# Patient Record
Sex: Female | Born: 1950
Health system: Southern US, Community
[De-identification: ages and names within clinical notes are randomized; demographics above are authoritative.]

## PROBLEM LIST (undated history)

## (undated) DIAGNOSIS — R42 Dizziness and giddiness: Secondary | ICD-10-CM

## (undated) DIAGNOSIS — K219 Gastro-esophageal reflux disease without esophagitis: Secondary | ICD-10-CM

## (undated) DIAGNOSIS — S2220XA Unspecified fracture of sternum, initial encounter for closed fracture: Secondary | ICD-10-CM

## (undated) DIAGNOSIS — M5136 Other intervertebral disc degeneration, lumbar region: Secondary | ICD-10-CM

## (undated) DIAGNOSIS — M5030 Other cervical disc degeneration, unspecified cervical region: Secondary | ICD-10-CM

## (undated) DIAGNOSIS — Z1371 Encounter for nonprocreative screening for genetic disease carrier status: Secondary | ICD-10-CM

## (undated) DIAGNOSIS — J45909 Unspecified asthma, uncomplicated: Secondary | ICD-10-CM

## (undated) DIAGNOSIS — E7800 Pure hypercholesterolemia, unspecified: Secondary | ICD-10-CM

## (undated) DIAGNOSIS — G540 Brachial plexus disorders: Secondary | ICD-10-CM

## (undated) DIAGNOSIS — Z923 Personal history of irradiation: Secondary | ICD-10-CM

## (undated) DIAGNOSIS — G8929 Other chronic pain: Secondary | ICD-10-CM

## (undated) DIAGNOSIS — E785 Hyperlipidemia, unspecified: Secondary | ICD-10-CM

## (undated) DIAGNOSIS — J449 Chronic obstructive pulmonary disease, unspecified: Secondary | ICD-10-CM

## (undated) DIAGNOSIS — C50919 Malignant neoplasm of unspecified site of unspecified female breast: Secondary | ICD-10-CM

## (undated) DIAGNOSIS — I10 Essential (primary) hypertension: Secondary | ICD-10-CM

## (undated) DIAGNOSIS — I890 Lymphedema, not elsewhere classified: Secondary | ICD-10-CM

## (undated) DIAGNOSIS — F419 Anxiety disorder, unspecified: Secondary | ICD-10-CM

## (undated) DIAGNOSIS — K5790 Diverticulosis of intestine, part unspecified, without perforation or abscess without bleeding: Secondary | ICD-10-CM

## (undated) DIAGNOSIS — M51369 Other intervertebral disc degeneration, lumbar region without mention of lumbar back pain or lower extremity pain: Secondary | ICD-10-CM

## (undated) DIAGNOSIS — M542 Cervicalgia: Secondary | ICD-10-CM

## (undated) HISTORY — DX: Hyperlipidemia, unspecified: E78.5

## (undated) HISTORY — DX: Malignant neoplasm of unspecified site of unspecified female breast: C50.919

## (undated) HISTORY — DX: Encounter for nonprocreative screening for genetic disease carrier status: Z13.71

## (undated) HISTORY — PX: APPENDECTOMY: SHX54

## (undated) HISTORY — PX: OTHER SURGICAL HISTORY: SHX169

## (undated) HISTORY — PX: CERVICAL SPINE SURGERY: SHX589

---

## 1998-12-27 ENCOUNTER — Encounter: Payer: Self-pay | Admitting: Neurosurgery

## 1998-12-31 ENCOUNTER — Encounter: Payer: Self-pay | Admitting: Neurosurgery

## 1998-12-31 ENCOUNTER — Inpatient Hospital Stay (HOSPITAL_COMMUNITY): Admission: RE | Admit: 1998-12-31 | Discharge: 1999-01-02 | Payer: Self-pay | Admitting: Neurosurgery

## 1999-06-02 HISTORY — PX: LUMBAR DISC SURGERY: SHX700

## 2001-06-01 DIAGNOSIS — C50919 Malignant neoplasm of unspecified site of unspecified female breast: Secondary | ICD-10-CM

## 2001-06-01 HISTORY — DX: Malignant neoplasm of unspecified site of unspecified female breast: C50.919

## 2001-06-01 HISTORY — PX: BREAST LUMPECTOMY: SHX2

## 2001-11-09 ENCOUNTER — Ambulatory Visit: Admission: RE | Admit: 2001-11-09 | Discharge: 2002-02-07 | Payer: Self-pay | Admitting: Radiation Oncology

## 2001-11-09 ENCOUNTER — Encounter (HOSPITAL_COMMUNITY): Admission: RE | Admit: 2001-11-09 | Discharge: 2001-12-09 | Payer: Self-pay | Admitting: Oncology

## 2002-02-20 ENCOUNTER — Encounter: Admission: RE | Admit: 2002-02-20 | Discharge: 2002-02-20 | Payer: Self-pay | Admitting: Oncology

## 2002-05-16 ENCOUNTER — Encounter: Admission: RE | Admit: 2002-05-16 | Discharge: 2002-05-16 | Payer: Self-pay | Admitting: Oncology

## 2002-05-16 ENCOUNTER — Encounter (HOSPITAL_COMMUNITY): Admission: RE | Admit: 2002-05-16 | Discharge: 2002-06-15 | Payer: Self-pay | Admitting: Oncology

## 2002-11-10 ENCOUNTER — Encounter: Payer: Self-pay | Admitting: Family Medicine

## 2002-11-10 ENCOUNTER — Ambulatory Visit (HOSPITAL_COMMUNITY): Admission: RE | Admit: 2002-11-10 | Discharge: 2002-11-10 | Payer: Self-pay | Admitting: Family Medicine

## 2002-11-27 ENCOUNTER — Encounter (HOSPITAL_COMMUNITY): Admission: RE | Admit: 2002-11-27 | Discharge: 2002-12-27 | Payer: Self-pay | Admitting: Oncology

## 2002-11-27 ENCOUNTER — Encounter: Admission: RE | Admit: 2002-11-27 | Discharge: 2002-11-27 | Payer: Self-pay | Admitting: Oncology

## 2003-02-21 ENCOUNTER — Encounter: Admission: RE | Admit: 2003-02-21 | Discharge: 2003-02-21 | Payer: Self-pay | Admitting: General Surgery

## 2003-02-21 ENCOUNTER — Encounter (INDEPENDENT_AMBULATORY_CARE_PROVIDER_SITE_OTHER): Payer: Self-pay | Admitting: Specialist

## 2003-02-21 ENCOUNTER — Encounter: Payer: Self-pay | Admitting: General Surgery

## 2003-09-19 ENCOUNTER — Encounter (HOSPITAL_COMMUNITY): Admission: RE | Admit: 2003-09-19 | Discharge: 2003-10-19 | Payer: Self-pay | Admitting: Oncology

## 2003-09-19 ENCOUNTER — Encounter: Admission: RE | Admit: 2003-09-19 | Discharge: 2003-09-19 | Payer: Self-pay | Admitting: Oncology

## 2006-06-25 ENCOUNTER — Emergency Department (HOSPITAL_COMMUNITY): Admission: EM | Admit: 2006-06-25 | Discharge: 2006-06-26 | Payer: Self-pay | Admitting: Emergency Medicine

## 2006-07-07 ENCOUNTER — Encounter: Admission: RE | Admit: 2006-07-07 | Discharge: 2006-07-07 | Payer: Self-pay

## 2007-04-04 ENCOUNTER — Encounter: Admission: RE | Admit: 2007-04-04 | Discharge: 2007-04-04 | Payer: Self-pay | Admitting: Unknown Physician Specialty

## 2008-03-23 ENCOUNTER — Other Ambulatory Visit: Admission: RE | Admit: 2008-03-23 | Discharge: 2008-03-23 | Payer: Self-pay | Admitting: Obstetrics & Gynecology

## 2008-03-26 ENCOUNTER — Ambulatory Visit: Payer: Self-pay | Admitting: Oncology

## 2008-03-30 ENCOUNTER — Encounter: Admission: RE | Admit: 2008-03-30 | Discharge: 2008-03-30 | Payer: Self-pay | Admitting: Obstetrics & Gynecology

## 2008-04-11 ENCOUNTER — Encounter: Admission: RE | Admit: 2008-04-11 | Discharge: 2008-04-11 | Payer: Self-pay | Admitting: Obstetrics & Gynecology

## 2008-05-02 ENCOUNTER — Ambulatory Visit (HOSPITAL_COMMUNITY): Admission: RE | Admit: 2008-05-02 | Discharge: 2008-05-02 | Payer: Self-pay | Admitting: Oncology

## 2008-06-01 HISTORY — PX: OOPHORECTOMY: SHX86

## 2008-06-11 ENCOUNTER — Ambulatory Visit (HOSPITAL_COMMUNITY): Admission: RE | Admit: 2008-06-11 | Discharge: 2008-06-11 | Payer: Self-pay | Admitting: Obstetrics & Gynecology

## 2008-06-11 ENCOUNTER — Encounter: Payer: Self-pay | Admitting: Obstetrics & Gynecology

## 2008-07-13 ENCOUNTER — Emergency Department (HOSPITAL_COMMUNITY): Admission: EM | Admit: 2008-07-13 | Discharge: 2008-07-14 | Payer: Self-pay | Admitting: Emergency Medicine

## 2008-08-09 ENCOUNTER — Ambulatory Visit: Payer: Self-pay | Admitting: Oncology

## 2008-08-13 LAB — COMPREHENSIVE METABOLIC PANEL
BUN: 12 mg/dL (ref 6–23)
CO2: 29 mEq/L (ref 19–32)
Calcium: 9 mg/dL (ref 8.4–10.5)
Chloride: 106 mEq/L (ref 96–112)
Creatinine, Ser: 0.59 mg/dL (ref 0.40–1.20)

## 2008-08-13 LAB — CBC WITH DIFFERENTIAL/PLATELET
Basophils Absolute: 0 10*3/uL (ref 0.0–0.1)
EOS%: 1.7 % (ref 0.0–7.0)
HCT: 38.7 % (ref 34.8–46.6)
HGB: 13.2 g/dL (ref 11.6–15.9)
MCH: 31 pg (ref 25.1–34.0)
MONO#: 0.6 10*3/uL (ref 0.1–0.9)
NEUT%: 50.1 % (ref 38.4–76.8)
lymph#: 2.3 10*3/uL (ref 0.9–3.3)

## 2009-05-10 ENCOUNTER — Encounter: Admission: RE | Admit: 2009-05-10 | Discharge: 2009-05-10 | Payer: Self-pay | Admitting: Obstetrics & Gynecology

## 2009-08-09 ENCOUNTER — Ambulatory Visit: Payer: Self-pay | Admitting: Oncology

## 2009-08-13 ENCOUNTER — Ambulatory Visit (HOSPITAL_COMMUNITY): Admission: RE | Admit: 2009-08-13 | Discharge: 2009-08-13 | Payer: Self-pay | Admitting: Oncology

## 2009-08-13 LAB — COMPREHENSIVE METABOLIC PANEL
CO2: 31 mEq/L (ref 19–32)
Creatinine, Ser: 0.65 mg/dL (ref 0.40–1.20)
Glucose, Bld: 91 mg/dL (ref 70–99)
Total Bilirubin: 0.6 mg/dL (ref 0.3–1.2)

## 2009-08-13 LAB — CBC WITH DIFFERENTIAL/PLATELET
Eosinophils Absolute: 0.2 10*3/uL (ref 0.0–0.5)
HCT: 38.2 % (ref 34.8–46.6)
LYMPH%: 34.5 % (ref 14.0–49.7)
MCHC: 34.7 g/dL (ref 31.5–36.0)
MCV: 94.6 fL (ref 79.5–101.0)
MONO#: 0.5 10*3/uL (ref 0.1–0.9)
NEUT#: 4.1 10*3/uL (ref 1.5–6.5)
NEUT%: 55.5 % (ref 38.4–76.8)
Platelets: 190 10*3/uL (ref 145–400)
WBC: 7.4 10*3/uL (ref 3.9–10.3)

## 2009-08-22 ENCOUNTER — Ambulatory Visit (HOSPITAL_COMMUNITY): Admission: RE | Admit: 2009-08-22 | Discharge: 2009-08-22 | Payer: Self-pay | Admitting: Oncology

## 2010-02-17 ENCOUNTER — Emergency Department (HOSPITAL_COMMUNITY): Admission: EM | Admit: 2010-02-17 | Discharge: 2010-02-17 | Payer: Self-pay | Admitting: Emergency Medicine

## 2010-03-03 ENCOUNTER — Ambulatory Visit (HOSPITAL_COMMUNITY): Admission: RE | Admit: 2010-03-03 | Discharge: 2010-03-03 | Payer: Self-pay | Admitting: Internal Medicine

## 2010-06-23 ENCOUNTER — Encounter
Admission: RE | Admit: 2010-06-23 | Discharge: 2010-06-23 | Payer: Self-pay | Source: Home / Self Care | Attending: Neurosurgery | Admitting: Neurosurgery

## 2010-07-02 ENCOUNTER — Encounter: Payer: Self-pay | Admitting: Obstetrics & Gynecology

## 2010-07-02 ENCOUNTER — Encounter: Payer: Self-pay | Admitting: Neurosurgery

## 2010-07-07 ENCOUNTER — Other Ambulatory Visit: Payer: Self-pay | Admitting: Obstetrics & Gynecology

## 2010-07-07 DIAGNOSIS — Z1231 Encounter for screening mammogram for malignant neoplasm of breast: Secondary | ICD-10-CM

## 2010-07-14 ENCOUNTER — Ambulatory Visit
Admission: RE | Admit: 2010-07-14 | Discharge: 2010-07-14 | Disposition: A | Discharge status: Home / Self Care | Payer: BC Managed Care – PPO | Source: Ambulatory Visit | Attending: Obstetrics & Gynecology | Admitting: Obstetrics & Gynecology

## 2010-07-14 DIAGNOSIS — Z1231 Encounter for screening mammogram for malignant neoplasm of breast: Secondary | ICD-10-CM

## 2010-07-24 ENCOUNTER — Other Ambulatory Visit: Payer: Self-pay | Admitting: Obstetrics & Gynecology

## 2010-07-24 DIAGNOSIS — R928 Other abnormal and inconclusive findings on diagnostic imaging of breast: Secondary | ICD-10-CM

## 2010-07-29 ENCOUNTER — Other Ambulatory Visit: Payer: Self-pay

## 2010-08-05 ENCOUNTER — Ambulatory Visit
Admission: RE | Admit: 2010-08-05 | Discharge: 2010-08-05 | Disposition: A | Discharge status: Home / Self Care | Payer: BC Managed Care – PPO | Source: Ambulatory Visit | Attending: Obstetrics & Gynecology | Admitting: Obstetrics & Gynecology

## 2010-08-05 DIAGNOSIS — R928 Other abnormal and inconclusive findings on diagnostic imaging of breast: Secondary | ICD-10-CM

## 2010-08-12 ENCOUNTER — Other Ambulatory Visit: Payer: Self-pay | Admitting: Sports Medicine

## 2010-08-12 ENCOUNTER — Ambulatory Visit
Admission: RE | Admit: 2010-08-12 | Discharge: 2010-08-12 | Disposition: A | Discharge status: Home / Self Care | Payer: BC Managed Care – PPO | Source: Ambulatory Visit | Attending: Sports Medicine | Admitting: Sports Medicine

## 2010-08-12 DIAGNOSIS — R0781 Pleurodynia: Secondary | ICD-10-CM

## 2010-08-13 LAB — CREATININE, SERUM
Creatinine, Ser: 0.68 mg/dL (ref 0.4–1.2)
GFR calc Af Amer: >60 mL/min (ref >60)
GFR calc non Af Amer: >60 mL/min (ref >60)

## 2010-08-14 LAB — DIFFERENTIAL
Basophils Absolute: 0 10*3/uL (ref 0.0–0.1)
Eosinophils Relative: 1 % (ref 0–5)
Lymphocytes Relative: 27 % (ref 12–46)
Lymphs Abs: 2.4 10*3/uL (ref 0.7–4.0)
Monocytes Absolute: 0.7 10*3/uL (ref 0.1–1.0)
Monocytes Relative: 8 % (ref 3–12)
Neutro Abs: 5.7 10*3/uL (ref 1.7–7.7)

## 2010-08-14 LAB — CBC
Platelets: 201 10*3/uL (ref 150–400)
RBC: 3.92 MIL/uL (ref 3.87–5.11)
WBC: 8.9 10*3/uL (ref 4.0–10.5)

## 2010-08-14 LAB — COMPREHENSIVE METABOLIC PANEL
AST: 43 U/L — ABNORMAL HIGH (ref 0–37)
Albumin: 4.1 g/dL (ref 3.5–5.2)
BUN: 9 mg/dL (ref 6–23)
Chloride: 100 mEq/L (ref 96–112)
Creatinine, Ser: 0.72 mg/dL (ref 0.4–1.2)
GFR calc Af Amer: >60 mL/min (ref >60)
Potassium: 3 mEq/L — ABNORMAL LOW (ref 3.5–5.1)
Total Bilirubin: 0.6 mg/dL (ref 0.3–1.2)
Total Protein: 7.6 g/dL (ref 6.0–8.3)

## 2010-09-15 LAB — CBC
HCT: 40.3 % (ref 36.0–46.0)
MCHC: 34.3 g/dL (ref 30.0–36.0)
MCV: 92.3 fL (ref 78.0–100.0)
Platelets: 208 10*3/uL (ref 150–400)
RDW: 12.8 % (ref 11.5–15.5)
WBC: 7.6 10*3/uL (ref 4.0–10.5)

## 2010-09-15 LAB — BASIC METABOLIC PANEL
BUN: 6 mg/dL (ref 6–23)
CO2: 30 mEq/L (ref 19–32)
Chloride: 100 mEq/L (ref 96–112)
Creatinine, Ser: 0.49 mg/dL (ref 0.4–1.2)
Glucose, Bld: 85 mg/dL (ref 70–99)
Potassium: 3 mEq/L — ABNORMAL LOW (ref 3.5–5.1)

## 2010-09-15 LAB — PREGNANCY, URINE: Preg Test, Ur: NEGATIVE

## 2010-09-15 LAB — URINALYSIS, ROUTINE W REFLEX MICROSCOPIC
Bilirubin Urine: NEGATIVE
Glucose, UA: NEGATIVE mg/dL
Ketones, ur: NEGATIVE mg/dL
Nitrite: POSITIVE — AB
Specific Gravity, Urine: <1.005 — ABNORMAL LOW (ref 1.005–1.030)
pH: 5.5 (ref 5.0–8.0)

## 2010-09-15 LAB — URINE MICROSCOPIC-ADD ON

## 2010-09-15 LAB — PROTIME-INR: Prothrombin Time: 12.8 seconds (ref 11.6–15.2)

## 2010-09-16 LAB — POCT CARDIAC MARKERS
CKMB, poc: 1.3 ng/mL (ref 1.0–8.0)
Troponin i, poc: <0.05 ng/mL (ref 0.00–0.09)

## 2010-09-16 LAB — CBC
HCT: 37.5 % (ref 36.0–46.0)
Platelets: 195 10*3/uL (ref 150–400)
RBC: 4.11 MIL/uL (ref 3.87–5.11)
WBC: 8.6 10*3/uL (ref 4.0–10.5)

## 2010-09-16 LAB — BASIC METABOLIC PANEL
BUN: 10 mg/dL (ref 6–23)
Creatinine, Ser: 0.58 mg/dL (ref 0.4–1.2)
GFR calc Af Amer: >60 mL/min (ref >60)
GFR calc non Af Amer: >60 mL/min (ref >60)
Potassium: 3.8 mEq/L (ref 3.5–5.1)

## 2010-09-16 LAB — DIFFERENTIAL
Lymphocytes Relative: 28 % (ref 12–46)
Lymphs Abs: 2.4 10*3/uL (ref 0.7–4.0)
Monocytes Relative: 7 % (ref 3–12)
Neutrophils Relative %: 62 % (ref 43–77)

## 2010-09-16 LAB — D-DIMER, QUANTITATIVE: D-Dimer, Quant: 0.34 ug/mL-FEU (ref 0.00–0.48)

## 2010-10-10 ENCOUNTER — Other Ambulatory Visit: Payer: Self-pay | Admitting: Neurosurgery

## 2010-10-10 DIAGNOSIS — M47812 Spondylosis without myelopathy or radiculopathy, cervical region: Secondary | ICD-10-CM

## 2010-10-13 ENCOUNTER — Ambulatory Visit
Admission: RE | Admit: 2010-10-13 | Discharge: 2010-10-13 | Disposition: A | Discharge status: Home / Self Care | Payer: BC Managed Care – PPO | Source: Ambulatory Visit | Attending: Neurosurgery | Admitting: Neurosurgery

## 2010-10-13 DIAGNOSIS — M47812 Spondylosis without myelopathy or radiculopathy, cervical region: Secondary | ICD-10-CM

## 2010-10-14 NOTE — Op Note (Signed)
NAME:  Carpino, Kamera             ACCOUNT NO.:  [id number]   MEDICAL RECORD NO.:  [id number]          PATIENT TYPE:  AMB   LOCATION:  SDC                           FACILITY:  [facility]   PHYSICIAN:  M. Leda Quail, MD  DATE OF BIRTH:  May 16, 1951   DATE OF PROCEDURE:  DATE OF DISCHARGE:                               OPERATIVE REPORT   PREOPERATIVE DIAGNOSES:  40. A 60 year old G2, P2 married white female with history of      premenopausal breast cancer.  2. Strong family history of breast and ovarian cancer with twin sister      who was diagnosed with breast cancer at age 63, two maternal aunts      who each had breast cancer and died before the age 64, and an older      sister who passed away before age 57 with ovarian cancer.  3. Gastroesophageal reflux disease secondary to radiation.  4. Hyperlipidemia.  5. Appendectomy at age 89.  6. History of normal spontaneous vaginal delivery x2.   POSTOPERATIVE DIAGNOSES:  68. A 60 year old G2 P2 married white female with history of      premenopausal breast cancer.  2. Strong family history of breast and ovarian cancer with twin sister      who was diagnosed with breast cancer at age 77, two maternal aunts      who each had breast cancer and died before the age 39, and an older      sister who passed away before the age 22 with ovarian cancer.  3. Gastroesophageal reflux disease secondary to radiation.  4. Hyperlipidemia.  5. Appendectomy at age 72.  6. History of normal spontaneous vaginal delivery x2.   PROCEDURE:  Laparoscopic bilateral salpingo-oophorectomy.   SURGEON:  M. Leda Quail, MD   ASSISTANT:  Edwena Felty. Romine, MD   ANESTHESIA:  General endotracheal.   FINDINGS:  Adhesions of the colon to the left side with small  postmenopausal appearing ovaries.  The uterus appeared normal.  Peritoneum appeared normal, upper abdomen appeared normal.   SPECIMENS:  Bilateral ovaries and tubes sent to Pathology.   ESTIMATED BLOOD  LOSS:  50 mL.   URINE OUTPUT:  225 mL of clear urine in Foley catheter.   FLUIDS:  1500 mL of LR.   COMPLICATIONS:  None.   INDICATIONS:  This is a 60 year old very nice G2 P2 married white female  with history of premenopausal breast cancer, age 60.  She has a very  strong family history of breast and ovarian cancer.  Twin sister was  diagnosed at age 14, her older sister was diagnosed at age 8 with  ovarian cancer and she is deceased.  She also has 2 aunts who died with  breast cancer before the age of 42.  The patient did undergo genetic  testing and this was negative.  However, Dr. Darnelle Catalan who is her  oncologist feels that she has significant risks in the future for  ovarian cancer that she probably has genetic risks that is just  currently unidentifiable.  Because of this risk and her strong family  history, he recommended consultation to consider ovary removal.  She  came to me for this.  We talked about the procedure, risks, and benefits  which in her case, the benefits are a little bit more cloudy, but given  her strong family history, I am in agreement with Dr. Darnelle Catalan.  After  consideration she decided to proceed with surgery and is here for this  today.   PROCEDURE:  The patient was taken to the operating room.  She was placed  in the supine position.  General endotracheal anesthesia was  administered by the anesthesia staff without difficulty.  The glide  scope was necessary for easy intubation because of the smallest upper  oropharynx.  Running IV was present in the left hand.  The right arm was  placed out on the arm board, the left arm was tucked.   The abdomen, perineum, inner thighs, and vagina prepped in normal  sterile fashion.  Attention was turned toward the vagina.  Legs were  lifted in the high lithotomy position.  Her legs had previously been  positioned in the low lithotomy position in the [place] stirrups.  A  bivalve speculum was placed in vagina.  The  anterior lip of the cervix  grasped with a single-tooth tenaculum.  An Acorn uterine manipulator was  attached to the cervical canal and attached to the tenaculum as a means  to manipulate uterus during the case.  Foley catheter was inserted in  the bladder under sterile conditions.  Then, the patient is draped in  normal sterile fashion.  The speculum was removed from the vagina before  she was draped.  Legs are positioned in the low lithotomy position.  Attention turned to the abdomen.  A 5 mL of 0.25% Marcaine are instilled  beneath the umbilicus.  A 10 mm skin incision was made with a #11 blade.  Subcutaneous fat tissue was dissected.  The abdomen was elevated.  A  Veress needle was obtained.  It was aimed towards the pelvis.  Fascial  layer and the peritoneum were felt as they are popped through.  The  syringe of the sterile saline is obtained.  It was attached to the  Veress needle.  An aspiration was performed without any blood or fluid  being noted.  Fluid goes easily into the syringe and then aspiration was  performed without any blood fluid or saline being noted.  Fluid drips  easily down the Veress needle.  CO2 gas was attached to the Veress  needle and under low pressures pneumoperitoneum was achieved without  difficulty.  Once 2.5 liter of CO2 gas into the abdomen, the Veress  needle was removed.  A diagnostic laparoscope was obtained.  The OptiVu  trocar and port were attached to the laparoscope.  The abdomen was  elevated and under direct visualization with the twisting motion of the  OptiVu non bladed port was passed through the abdominal layers and into  the abdomen.  Trocars removed and the port is pushed to elevate further.  The gas attached to the port under high flow.  Intraperitoneal placement  was noted.  The patient was placed in Trendelenburg.  The uterus was  elevated.  The right ovary was easily visualized, right ureter was  easily visualized.  Left tube and  ovary are mobile, but there is  adherence to the left colon and some omentum on the left side making  more difficult to visualize the left side.   The abdominal wall layers  were transilluminated at this point with the  light of the laparoscope to visualize the vasculature.  Two inferior  quadrant ports were located.  A 2.5 mL of 0.25% Marcaine are instilled  on each side.  A 5-mm skin incision was made with #11 blade.  A 5-mm  blunt trocar and ports were placed in the right and left lower quadrant  under direct visualization of laparoscope.  Then, using Endoshears with  monopolar cautery attached and smooth graspers, the omentum was freed  off the left sidewall, as well as the colon to better visualize the left  IP ligament.  Ureter was not identified at this point.  Attempt to  visualize this was made up at the pelvic brim without success and then  down the pelvic sidewall.  The sidewall has no bowel or adhesions in the  way.  However, the ureter was not seen.   Decision was made to go ahead and turn attention to the right side.  The  right IP ligament was serially clamped, cauterized, and incised using a  tripolar gyrus.  This was done until the entire IP ligament was  completely traversed.  Then, the utero-ovarian pedicle was clamped,  cauterized, and cut using a sound indicator to ensure complete  cauterization is complete.  Then, the remainder of the broad ligament  was serially clamped, cauterized, and traversed.  This was all done by  keeping any instruments off the pelvic sidewall.  The ureter was noted  to peristalsis multiple times during this portion of the procedure.  The  ovary was placed on top of the uterus and the pelvis.  At this point,  the patient was noted to be sliding up the table.  The patient was taken  out of Trendelenburg.  She was pulled back down the table.  The  instruments in the vagina were loose.  Legs were positioned in the high  lithotomy position.   Attention was turned back to the vagina.  Bivalve  speculum was placed in the vagina.  The tenaculum and the Acorn uterine  manipulator were removed off of the cervix.  The tenaculum was then used  to grasp the anterior lip of the cervix while the Hulka clamp was passed  through the cervical canal.  This was attached to the anterior lip of  cervix and the tenaculum was removed.  This gave better manipulation of  the uterus.  Sterile gloves and gowns were changed by the surgeon who  did this portion of the procedure.   Attention was then turned back to the laparoscopic portion of the  procedure.  Again, the sidewall was well visualized without any  difficulty, but ureter cannot be visualized.  The round ligament was  then serially clamped, cauterized, and incised.  The posterior leaf of  the broad ligament was opened along the pelvic sidewall.  Using blunt  probe, the retroperitoneal space was dissected.  The ureter still could  not be visualized.  At this point, the IP ligament was visualized well  above the level of the dissection and IP ligament was completely  isolated at this point.  Approximately 40 minutes had been spent trying  to identify the ureter at this point and therefore decision was made to  go ahead and traverse the IP ligament.  Using a tripolar gyrus, this  pedicle was clamped, cauterized, and cut.  The sound indicator was used  to ensure complete cauterization of the pedicle.  Once the IP ligament  was  completely traversed, the utero-ovarian pedicle was completely  clamped, cauterized, and incised and then the left ovary was completely  freed.  Further visualization in the retroperitoneal space could not  identify the ureter.  I do feel that the IP ligament was very clearly  seen and the incision made beneath the level of the ovary across the IP  ligament was safe that the left ureter was never clearly seen during the  procedure.  At this point, the two ovaries were  removed from the midline  port using the operative hysteroscope and a toothed grasper.  Nezhat  suction irrigator was then used to irrigate the pelvis.  No bleeding was  noted on the sidewalls.  The right ureter was again noted very clearly  and easily.  The irrigant was removed from the pelvis.  The right and  left lower quadrant ports and instruments were removed under direct  visualization on laparoscope.  No bleeding was noted.  The  pneumoperitoneum was released.  The laparoscope was removed.  The  patient was positioned back in supine position.  Several deep breaths  were given by the anesthesiologist to ensure that the gas was out of the  abdomen.  The midline port was then removed.  The fascia was identified  at the midline and closed with a figure-of-eight suture of #0 Vicryl.  The skin was then closed, the umbilicus with subcuticular stitch of 4-0  Vicryl.  Incisions were cleaned.  Dermabond was used to close the  inferior incisions, we made the midline incision watertight.  Instruments were then removed from the vagina.  No bleeding was noted  from the anterior lip of the cervix.  Legs were positioned back in the  supine position and taken down the Allen stirrups.  SCDs were present on  lower extremities throughout the entire procedure.  The patient  tolerated the procedure well.  The Betadine prep was cleansed from her  abdomen.  Sponge, lap, needle, and instrument counts were correct x2.  She was awake from anesthesia, extubated, and taken to recovery room in  stable condition.      Lum Keas, MD  Electronically Signed    MSM/MEDQ  D:  06/11/2008  T:  06/11/2008  Job:  [id number]   cc:   Valentino Hue. Magrinat, M.D.  Fax: [phone]

## 2010-11-03 ENCOUNTER — Other Ambulatory Visit: Payer: Self-pay | Admitting: Oncology

## 2010-11-03 ENCOUNTER — Encounter (HOSPITAL_BASED_OUTPATIENT_CLINIC_OR_DEPARTMENT_OTHER): Payer: BC Managed Care – PPO | Admitting: Oncology

## 2010-11-03 DIAGNOSIS — C50919 Malignant neoplasm of unspecified site of unspecified female breast: Secondary | ICD-10-CM

## 2010-11-03 DIAGNOSIS — Z170 Estrogen receptor positive status [ER+]: Secondary | ICD-10-CM

## 2010-11-03 DIAGNOSIS — C50419 Malignant neoplasm of upper-outer quadrant of unspecified female breast: Secondary | ICD-10-CM

## 2010-11-03 LAB — CBC WITH DIFFERENTIAL/PLATELET
Basophils Absolute: 0 10*3/uL (ref 0.0–0.1)
EOS%: 0.9 % (ref 0.0–7.0)
HCT: 35.4 % (ref 34.8–46.6)
HGB: 12.1 g/dL (ref 11.6–15.9)
LYMPH%: 26.5 % (ref 14.0–49.7)
MCH: 32.4 pg (ref 25.1–34.0)
MCV: 95.1 fL (ref 79.5–101.0)
MONO%: 6.3 % (ref 0.0–14.0)
NEUT%: 65.7 % (ref 38.4–76.8)
Platelets: 154 10*3/uL (ref 145–400)
lymph#: 1.8 10*3/uL (ref 0.9–3.3)

## 2010-11-03 LAB — COMPREHENSIVE METABOLIC PANEL
AST: 23 U/L (ref 0–37)
BUN: 19 mg/dL (ref 6–23)
Calcium: 10.3 mg/dL (ref 8.4–10.5)
Chloride: 101 mEq/L (ref 96–112)
Creatinine, Ser: 0.75 mg/dL (ref 0.50–1.10)

## 2010-12-01 ENCOUNTER — Other Ambulatory Visit: Payer: Self-pay | Admitting: Neurosurgery

## 2010-12-01 DIAGNOSIS — M47812 Spondylosis without myelopathy or radiculopathy, cervical region: Secondary | ICD-10-CM

## 2010-12-10 ENCOUNTER — Ambulatory Visit
Admission: RE | Admit: 2010-12-10 | Discharge: 2010-12-10 | Disposition: A | Discharge status: Home / Self Care | Payer: BC Managed Care – PPO | Source: Ambulatory Visit | Attending: Neurosurgery | Admitting: Neurosurgery

## 2010-12-10 VITALS — BP 123/66 | HR 72

## 2010-12-10 DIAGNOSIS — M47812 Spondylosis without myelopathy or radiculopathy, cervical region: Secondary | ICD-10-CM

## 2010-12-12 ENCOUNTER — Telehealth: Payer: Self-pay | Admitting: Diagnostic Radiology

## 2010-12-12 NOTE — Telephone Encounter (Signed)
Pt called to state she had a lot of pain and discomfort post her injection and had not had these symptoms with the last 2 she had. Explained that it was not unusual for pt's to be more sore for a few days after. Pt did say she felt better today that she did yesterday.dd

## 2011-01-30 ENCOUNTER — Ambulatory Visit (HOSPITAL_COMMUNITY)
Admission: RE | Admit: 2011-01-30 | Discharge: 2011-01-30 | Disposition: A | Discharge status: Home / Self Care | Payer: No Typology Code available for payment source | Source: Ambulatory Visit | Attending: Internal Medicine | Admitting: Internal Medicine

## 2011-01-30 ENCOUNTER — Other Ambulatory Visit (HOSPITAL_COMMUNITY): Payer: Self-pay | Admitting: Internal Medicine

## 2011-01-30 DIAGNOSIS — Z853 Personal history of malignant neoplasm of breast: Secondary | ICD-10-CM | POA: Insufficient documentation

## 2011-01-30 DIAGNOSIS — M7989 Other specified soft tissue disorders: Secondary | ICD-10-CM

## 2011-02-06 ENCOUNTER — Other Ambulatory Visit (HOSPITAL_COMMUNITY): Payer: Self-pay | Admitting: Internal Medicine

## 2011-02-06 DIAGNOSIS — R609 Edema, unspecified: Secondary | ICD-10-CM

## 2011-02-10 ENCOUNTER — Other Ambulatory Visit (HOSPITAL_COMMUNITY): Payer: Self-pay | Admitting: Internal Medicine

## 2011-02-10 ENCOUNTER — Ambulatory Visit (HOSPITAL_COMMUNITY)
Admission: RE | Admit: 2011-02-10 | Discharge: 2011-02-10 | Disposition: A | Discharge status: Home / Self Care | Payer: BC Managed Care – PPO | Source: Ambulatory Visit | Attending: Internal Medicine | Admitting: Internal Medicine

## 2011-02-10 DIAGNOSIS — R609 Edema, unspecified: Secondary | ICD-10-CM

## 2011-02-10 DIAGNOSIS — R220 Localized swelling, mass and lump, head: Secondary | ICD-10-CM | POA: Insufficient documentation

## 2011-02-10 MED ORDER — IOHEXOL 300 MG/ML  SOLN
75.0000 mL | Freq: Once | INTRAMUSCULAR | Status: AC | PRN
  Administered 2011-02-10: 75 mL via INTRAVENOUS

## 2011-06-02 DIAGNOSIS — S2220XA Unspecified fracture of sternum, initial encounter for closed fracture: Secondary | ICD-10-CM

## 2011-06-02 HISTORY — PX: OTHER SURGICAL HISTORY: SHX169

## 2011-06-02 HISTORY — DX: Unspecified fracture of sternum, initial encounter for closed fracture: S22.20XA

## 2011-08-31 HISTORY — PX: THORACIC OUTLET SURGERY: SHX2502

## 2011-10-14 ENCOUNTER — Telehealth: Payer: Self-pay | Admitting: Oncology

## 2011-10-14 NOTE — Telephone Encounter (Signed)
lmonvm adviising the pt of her June appts with dr Darnelle Catalan

## 2011-11-03 ENCOUNTER — Ambulatory Visit (HOSPITAL_BASED_OUTPATIENT_CLINIC_OR_DEPARTMENT_OTHER): Payer: BC Managed Care – PPO | Admitting: Oncology

## 2011-11-03 ENCOUNTER — Other Ambulatory Visit (HOSPITAL_BASED_OUTPATIENT_CLINIC_OR_DEPARTMENT_OTHER): Payer: BC Managed Care – PPO | Admitting: Lab

## 2011-11-03 VITALS — BP 156/84 | HR 81 | Temp 98.1°F | Ht 66.5 in | Wt 195.7 lb

## 2011-11-03 DIAGNOSIS — C50919 Malignant neoplasm of unspecified site of unspecified female breast: Secondary | ICD-10-CM

## 2011-11-03 DIAGNOSIS — Z170 Estrogen receptor positive status [ER+]: Secondary | ICD-10-CM

## 2011-11-03 DIAGNOSIS — Z853 Personal history of malignant neoplasm of breast: Secondary | ICD-10-CM

## 2011-11-03 LAB — CBC WITH DIFFERENTIAL/PLATELET
Eosinophils Absolute: 0.1 10*3/uL (ref 0.0–0.5)
HCT: 37 % (ref 34.8–46.6)
LYMPH%: 30.9 % (ref 14.0–49.7)
MCV: 91.2 fL (ref 79.5–101.0)
MONO#: 0.6 10*3/uL (ref 0.1–0.9)
MONO%: 7.3 % (ref 0.0–14.0)
NEUT#: 5.1 10*3/uL (ref 1.5–6.5)
NEUT%: 59.8 % (ref 38.4–76.8)
Platelets: 219 10*3/uL (ref 145–400)
WBC: 8.6 10*3/uL (ref 3.9–10.3)

## 2011-11-03 LAB — COMPREHENSIVE METABOLIC PANEL
BUN: 18 mg/dL (ref 6–23)
CO2: 28 mEq/L (ref 19–32)
Calcium: 10 mg/dL (ref 8.4–10.5)
Chloride: 101 mEq/L (ref 96–112)
Creatinine, Ser: 0.73 mg/dL (ref 0.50–1.10)
Glucose, Bld: 100 mg/dL — ABNORMAL HIGH (ref 70–99)

## 2011-11-03 MED ORDER — RALOXIFENE HCL 60 MG PO TABS: 60.0000 mg | ORAL_TABLET | Freq: Every day | ORAL | Status: DC

## 2011-11-03 NOTE — Progress Notes (Signed)
ID: Taylor Koch   DOB: [birth year]  MR#: [id number]  NWG#:[id number]  HISTORY OF PRESENT ILLNESS: Taylor Koch is a 61 year old [place] woman, formerly followed by Dr. Mariel Sleet with a history of breast cancer, establishing herself in our practice November 2009.    She underwent left lumpectomy followed by sentinel lymph node biopsy and re-excision for margins May 2003 under [facility] for what proved to be a T1A N0 M0, grade 1, invasive ductal carcinoma (specifically 5 mm), with zero of three sentinel lymph nodes involved, ER 90% and PR 10% positive, Hercept test negative.  She was treated with radiation under [facility], completed in July 2003, and she started Arimidex at that time.  She continued on Arimidex until June 2008.  [place] and had not seen Dr. Mariel Sleet in some time, failed to get her prescription for Arimidex renewed, and did not keep up with further appointments with him.   I should add that due to a provocative positive family history for breast cancer, she was evaluated for the BRCA1 and 2 genes September 2003, and was found to be negative.    Her subsequent history is as detailed below   INTERVAL HISTORY: Taylor Koch returns today for routine followup of her breast cancer. Since her last visit here she was diagnosed with left thoracic outlet syndrome and referred to Leonia Corona at [facility]. She had release surgery 08/31/2011.  REVIEW OF SYSTEMS: She still hurts from that surgery, and because she is using her right arm more she thinks she may be developing some shoulder problem there. That of course would be the postlumpectomy arm. She has minimal incontinence lymphedema of the left upper extremity. Aside from these issues of she's had a mild earache in the left ear, occasional bilateral ankle swelling, some evidence of costochondritis by history, easy bruising, and anxiety. A detailed review of systems was otherwise stable  PAST MEDICAL  HISTORY: The past medical history is significant for hypercholesterolemia, mild osteopenia, history of COPD/asthma, moderate obesity, status post appendectomy, status post cervical disc repair in 2000, history of fracture to the left arm during skating at her five-year-old granddaughter's party  status post thoracic outlet syndrome release as just discussed  FAMILY HISTORY The patient's mother was diagnosed with breast cancer at age 29.  She is now 60 years old.  The patient's father died from complications of diabetes and congestive heart failure at the age of 22.  He also had a history of melanoma.  The patient has a twin sister, who was diagnosed with breast cancer at age 44.  The patient's mother has three sisters, all with a diagnosis of breast cancer.  The patient has a half-sister also on her mother's side with a history of ovarian cancer, dying from ovarian cancer at age 74.   GYNECOLOGIC HISTORY: She is GX P2, menarche age 61, first pregnancy to term age 82.  She never took hormone replacement.    SOCIAL HISTORY: She works for the [employer] as a Engineer, structural.  Her husband, Taylor Koch, is retired from the post office.  He has a history of rectal cancer. Their son, Taylor Koch, lives and works in [place], and their daughter, Taylor Koch, also lives in [place] and works for [employer].  Taylor Koch has a nine-year-old daughter.  The patient is a Control and instrumentation engineer.    ADVANCED DIRECTIVES: not in place  HEALTH MAINTENANCE: History  Substance Use Topics  . Smoking status: Not on file  . Smokeless  tobacco: Not on file  . Alcohol Use: Not on file     Colonoscopy:  PAP: [facility]  Bone density:  Lipid panel:  Allergies  Allergen Reactions  . Hydrocodone Itching  . Penicillins Hives    No current outpatient prescriptions on file.    OBJECTIVE: Middle-aged white woman who appears well Filed Vitals:   11/03/11 1359  BP: 156/84  Pulse: 81  Temp: 98.1 F (36.7 C)     Body mass index  is 31.11 kg/(m^2).    ECOG FS: 0  Sclerae unicteric Oropharynx clear No cervical or supraclavicular adenopathy Lungs no rales or rhonchi Heart regular rate and rhythm Abd benign MSK no focal spinal tenderness; I do not detect any significant lymphedema of the left upper extremity. She has good range of motion in the right shoulder/right upper extremity, and I do not palpate any abnormality in the right axilla. Neuro: nonfocal Breasts: The right breast is status post lumpectomy. There is no evidence of local recurrence. The left breast is unremarkable.  LAB RESULTS: Lab Results  Component Value Date   WBC 8.6 11/03/2011   NEUTROABS 5.1 11/03/2011   HGB 12.7 11/03/2011   HCT 37.0 11/03/2011   MCV 91.2 11/03/2011   PLT 219 11/03/2011      Chemistry      Component Value Date/Time   NA 140 11/03/2010 1420   K 3.1* 11/03/2010 1420   CL 101 11/03/2010 1420   CO2 28 11/03/2010 1420   BUN 19 11/03/2010 1420   CREATININE 0.75 11/03/2010 1420      Component Value Date/Time   CALCIUM 10.3 11/03/2010 1420   ALKPHOS 63 11/03/2010 1420   AST 23 11/03/2010 1420   ALT 20 11/03/2010 1420   BILITOT 0.6 11/03/2010 1420       No results found for this basename: LABCA2    No components found with this basename: LABCA125    No results found for this basename: INR:1;PROTIME:1 in the last 168 hours  Urinalysis    Component Value Date/Time   COLORURINE YELLOW 06/08/2008 1430   APPEARANCEUR CLEAR 06/08/2008 1430   LABSPEC <1.005* 06/08/2008 1430   PHURINE 5.5 06/08/2008 1430   GLUCOSEU NEGATIVE 06/08/2008 1430   HGBUR NEGATIVE 06/08/2008 1430   BILIRUBINUR NEGATIVE 06/08/2008 1430   KETONESUR NEGATIVE 06/08/2008 1430   PROTEINUR NEGATIVE 06/08/2008 1430   UROBILINOGEN 0.2 06/08/2008 1430   NITRITE POSITIVE* 06/08/2008 1430   LEUKOCYTESUR SMALL* 06/08/2008 1430    STUDIES: Mammography at [facility] March 2013 was reportedly within normal limits (we have requested a copy of the report).  ASSESSMENT: 61 year old BRCA 1-2 negative  [place] woman status post right lumpectomy and sentinel lymph node biopsy in May 2003 for a grade 1 invasive ductal carcinoma measuring 5 mm with no lymph node involvement, ER 90% and PR 10% positive with a negative Hercept test after radiation.  She took Arimidex between July 2003 and 2008, then started on Evista 2010.  She is status post bilateral salpingo-oophorectomy.   PLAN: Kellie is doing very well as far as her breast cancer is concerned. She has an excellent prognosis with regards to the one that was removed 10 years ago. Of course she remains at high risk of developing a new breast cancer given her family history. Nevertheless at this point I feel comfortable releasing her to her primary care physicians, Dr. Margo Aye and Dr. Hyacinth Meeker. Of course I will be glad to see her at any point, but as of now no further appointments  have been made for her here.   Hennessey Cantrell C    11/03/2011

## 2012-04-25 ENCOUNTER — Other Ambulatory Visit: Payer: Self-pay | Admitting: Neurosurgery

## 2012-04-25 DIAGNOSIS — M47812 Spondylosis without myelopathy or radiculopathy, cervical region: Secondary | ICD-10-CM

## 2012-04-29 ENCOUNTER — Ambulatory Visit
Admission: RE | Admit: 2012-04-29 | Discharge: 2012-04-29 | Disposition: A | Discharge status: Home / Self Care | Payer: BC Managed Care – PPO | Source: Ambulatory Visit | Attending: Neurosurgery | Admitting: Neurosurgery

## 2012-04-29 VITALS — BP 133/56 | HR 73

## 2012-04-29 DIAGNOSIS — M5020 Other cervical disc displacement, unspecified cervical region: Secondary | ICD-10-CM

## 2012-04-29 DIAGNOSIS — M47812 Spondylosis without myelopathy or radiculopathy, cervical region: Secondary | ICD-10-CM

## 2012-04-29 MED ORDER — IOHEXOL 300 MG/ML  SOLN
1.0000 mL | Freq: Once | INTRAMUSCULAR | Status: AC | PRN
  Administered 2012-04-29: 1 mL via EPIDURAL

## 2012-04-29 MED ORDER — TRIAMCINOLONE ACETONIDE 40 MG/ML IJ SUSP (RADIOLOGY)
60.0000 mg | Freq: Once | INTRAMUSCULAR | Status: AC
  Administered 2012-04-29: 60 mg via EPIDURAL

## 2012-08-01 ENCOUNTER — Emergency Department (HOSPITAL_COMMUNITY): Payer: BC Managed Care – PPO

## 2012-08-01 ENCOUNTER — Encounter (HOSPITAL_COMMUNITY): Payer: Self-pay | Admitting: *Deleted

## 2012-08-01 ENCOUNTER — Observation Stay (HOSPITAL_COMMUNITY)
Admission: EM | Admit: 2012-08-01 | Discharge: 2012-08-02 | Disposition: A | Discharge status: Home / Self Care | Payer: BC Managed Care – PPO | Attending: Internal Medicine | Admitting: Internal Medicine

## 2012-08-01 DIAGNOSIS — R202 Paresthesia of skin: Secondary | ICD-10-CM | POA: Diagnosis present

## 2012-08-01 DIAGNOSIS — J449 Chronic obstructive pulmonary disease, unspecified: Secondary | ICD-10-CM | POA: Diagnosis present

## 2012-08-01 DIAGNOSIS — R7401 Elevation of levels of liver transaminase levels: Secondary | ICD-10-CM | POA: Diagnosis present

## 2012-08-01 DIAGNOSIS — Z3490 Encounter for supervision of normal pregnancy, unspecified, unspecified trimester: Secondary | ICD-10-CM

## 2012-08-01 DIAGNOSIS — R079 Chest pain, unspecified: Secondary | ICD-10-CM

## 2012-08-01 DIAGNOSIS — R0602 Shortness of breath: Secondary | ICD-10-CM | POA: Insufficient documentation

## 2012-08-01 DIAGNOSIS — F411 Generalized anxiety disorder: Secondary | ICD-10-CM | POA: Insufficient documentation

## 2012-08-01 DIAGNOSIS — E876 Hypokalemia: Secondary | ICD-10-CM | POA: Insufficient documentation

## 2012-08-01 DIAGNOSIS — F419 Anxiety disorder, unspecified: Secondary | ICD-10-CM | POA: Diagnosis present

## 2012-08-01 DIAGNOSIS — K219 Gastro-esophageal reflux disease without esophagitis: Secondary | ICD-10-CM | POA: Diagnosis present

## 2012-08-01 DIAGNOSIS — R209 Unspecified disturbances of skin sensation: Secondary | ICD-10-CM | POA: Insufficient documentation

## 2012-08-01 DIAGNOSIS — J4489 Other specified chronic obstructive pulmonary disease: Secondary | ICD-10-CM | POA: Insufficient documentation

## 2012-08-01 DIAGNOSIS — I10 Essential (primary) hypertension: Secondary | ICD-10-CM | POA: Insufficient documentation

## 2012-08-01 DIAGNOSIS — H6692 Otitis media, unspecified, left ear: Secondary | ICD-10-CM | POA: Diagnosis present

## 2012-08-01 DIAGNOSIS — H6690 Otitis media, unspecified, unspecified ear: Secondary | ICD-10-CM | POA: Insufficient documentation

## 2012-08-01 DIAGNOSIS — R0789 Other chest pain: Principal | ICD-10-CM | POA: Diagnosis present

## 2012-08-01 HISTORY — DX: Chronic obstructive pulmonary disease, unspecified: J44.9

## 2012-08-01 HISTORY — DX: Cervicalgia: M54.2

## 2012-08-01 HISTORY — DX: Other cervical disc degeneration, unspecified cervical region: M50.30

## 2012-08-01 HISTORY — DX: Other chronic pain: G89.29

## 2012-08-01 HISTORY — DX: Gastro-esophageal reflux disease without esophagitis: K21.9

## 2012-08-01 HISTORY — DX: Unspecified fracture of sternum, initial encounter for closed fracture: S22.20XA

## 2012-08-01 HISTORY — DX: Other intervertebral disc degeneration, lumbar region without mention of lumbar back pain or lower extremity pain: M51.369

## 2012-08-01 HISTORY — DX: Dizziness and giddiness: R42

## 2012-08-01 HISTORY — DX: Lymphedema, not elsewhere classified: I89.0

## 2012-08-01 HISTORY — DX: Pure hypercholesterolemia, unspecified: E78.00

## 2012-08-01 HISTORY — DX: Anxiety disorder, unspecified: F41.9

## 2012-08-01 HISTORY — DX: Other intervertebral disc degeneration, lumbar region: M51.36

## 2012-08-01 HISTORY — DX: Brachial plexus disorders: G54.0

## 2012-08-01 HISTORY — DX: Essential (primary) hypertension: I10

## 2012-08-01 HISTORY — DX: Diverticulosis of intestine, part unspecified, without perforation or abscess without bleeding: K57.90

## 2012-08-01 LAB — BASIC METABOLIC PANEL
CO2: 28 mEq/L (ref 19–32)
Chloride: 96 mEq/L (ref 96–112)
Creatinine, Ser: 0.96 mg/dL (ref 0.50–1.10)
Glucose, Bld: 156 mg/dL — ABNORMAL HIGH (ref 70–99)

## 2012-08-01 LAB — LIPID PANEL
HDL: 51 mg/dL (ref >39)
LDL Cholesterol: 96 mg/dL (ref 0–99)
Total CHOL/HDL Ratio: 3.7 RATIO

## 2012-08-01 LAB — CBC WITH DIFFERENTIAL/PLATELET
Basophils Absolute: 0 10*3/uL (ref 0.0–0.1)
HCT: 37.5 % (ref 36.0–46.0)
Hemoglobin: 13.2 g/dL (ref 12.0–15.0)
Lymphocytes Relative: 17 % (ref 12–46)
Lymphs Abs: 1.4 10*3/uL (ref 0.7–4.0)
Monocytes Absolute: 0.5 10*3/uL (ref 0.1–1.0)
Monocytes Relative: 6 % (ref 3–12)
Neutro Abs: 6.4 10*3/uL (ref 1.7–7.7)
RBC: 4.2 MIL/uL (ref 3.87–5.11)
RDW: 12.5 % (ref 11.5–15.5)
WBC: 8.5 10*3/uL (ref 4.0–10.5)

## 2012-08-01 LAB — TROPONIN I
Troponin I: <0.3 ng/mL (ref <0.30)
Troponin I: <0.3 ng/mL (ref <0.30)

## 2012-08-01 MED ORDER — POTASSIUM CHLORIDE CRYS ER 20 MEQ PO TBCR
40.0000 meq | EXTENDED_RELEASE_TABLET | Freq: Once | ORAL | Status: AC
  Administered 2012-08-01: 40 meq via ORAL
  Filled 2012-08-01: qty 2

## 2012-08-01 MED ORDER — ASPIRIN EC 325 MG PO TBEC
325.0000 mg | DELAYED_RELEASE_TABLET | Freq: Every day | ORAL | Status: DC
  Administered 2012-08-01 – 2012-08-02 (×2): 325 mg via ORAL
  Filled 2012-08-01 (×3): qty 1

## 2012-08-01 MED ORDER — MORPHINE SULFATE 2 MG/ML IJ SOLN
2.0000 mg | INTRAMUSCULAR | Status: DC | PRN
  Administered 2012-08-01: 2 mg via INTRAVENOUS
  Filled 2012-08-01: qty 1

## 2012-08-01 MED ORDER — SODIUM CHLORIDE 0.9 % IJ SOLN
3.0000 mL | Freq: Two times a day (BID) | INTRAMUSCULAR | Status: DC
  Administered 2012-08-01 – 2012-08-02 (×3): 3 mL via INTRAVENOUS

## 2012-08-01 MED ORDER — PANTOPRAZOLE SODIUM 40 MG PO TBEC
40.0000 mg | DELAYED_RELEASE_TABLET | Freq: Every day | ORAL | Status: DC
  Administered 2012-08-02: 40 mg via ORAL
  Filled 2012-08-01 (×3): qty 1

## 2012-08-01 MED ORDER — POTASSIUM CHLORIDE 10 MEQ/100ML IV SOLN
10.0000 meq | Freq: Once | INTRAVENOUS | Status: AC
  Administered 2012-08-01: 10 meq via INTRAVENOUS
  Filled 2012-08-01: qty 100

## 2012-08-01 MED ORDER — IRBESARTAN 75 MG PO TABS
75.0000 mg | ORAL_TABLET | Freq: Every day | ORAL | Status: DC
  Administered 2012-08-02: 75 mg via ORAL
  Filled 2012-08-01 (×3): qty 1

## 2012-08-01 MED ORDER — PROPRANOLOL HCL 20 MG PO TABS
10.0000 mg | ORAL_TABLET | Freq: Two times a day (BID) | ORAL | Status: DC
  Administered 2012-08-01 – 2012-08-02 (×2): 10 mg via ORAL
  Filled 2012-08-01 (×3): qty 1

## 2012-08-01 MED ORDER — ENOXAPARIN SODIUM 40 MG/0.4ML ~~LOC~~ SOLN
40.0000 mg | SUBCUTANEOUS | Status: DC
  Administered 2012-08-01: 40 mg via SUBCUTANEOUS
  Filled 2012-08-01 (×2): qty 0.4

## 2012-08-01 MED ORDER — MORPHINE SULFATE 4 MG/ML IJ SOLN
4.0000 mg | INTRAMUSCULAR | Status: DC | PRN
  Administered 2012-08-01: 4 mg via INTRAVENOUS
  Filled 2012-08-01: qty 1

## 2012-08-01 MED ORDER — ALPRAZOLAM 0.25 MG PO TABS
0.2500 mg | ORAL_TABLET | Freq: Three times a day (TID) | ORAL | Status: DC | PRN
  Administered 2012-08-01 – 2012-08-02 (×3): 0.25 mg via ORAL
  Filled 2012-08-01 (×3): qty 1

## 2012-08-01 MED ORDER — NITROGLYCERIN 0.4 MG SL SUBL
0.4000 mg | SUBLINGUAL_TABLET | SUBLINGUAL | Status: DC | PRN
Start: 2012-08-01 — End: 2012-08-02
  Administered 2012-08-01: 0.4 mg via SUBLINGUAL
  Filled 2012-08-01: qty 25

## 2012-08-01 MED ORDER — ASPIRIN 81 MG PO CHEW
324.0000 mg | CHEWABLE_TABLET | Freq: Once | ORAL | Status: AC
  Administered 2012-08-01: 324 mg via ORAL
  Filled 2012-08-01: qty 4

## 2012-08-01 MED ORDER — SODIUM CHLORIDE 0.9 % IV SOLN: INTRAVENOUS | Status: AC

## 2012-08-01 MED ORDER — SODIUM CHLORIDE 0.9 % IV SOLN
INTRAVENOUS | Status: DC
  Administered 2012-08-01: 16:00:00 via INTRAVENOUS

## 2012-08-01 MED ORDER — POTASSIUM CHLORIDE 20 MEQ/15ML (10%) PO LIQD: 40.0000 meq | Freq: Once | ORAL | Status: DC

## 2012-08-01 MED ORDER — ACETAMINOPHEN 325 MG PO TABS
650.0000 mg | ORAL_TABLET | Freq: Four times a day (QID) | ORAL | Status: DC | PRN
  Administered 2012-08-02 (×2): 650 mg via ORAL
  Filled 2012-08-01 (×2): qty 2

## 2012-08-01 MED ORDER — KETOROLAC TROMETHAMINE 30 MG/ML IJ SOLN
30.0000 mg | Freq: Once | INTRAMUSCULAR | Status: AC
  Administered 2012-08-01: 30 mg via INTRAVENOUS
  Filled 2012-08-01: qty 1

## 2012-08-01 MED ORDER — ACETAMINOPHEN 650 MG RE SUPP: 650.0000 mg | Freq: Four times a day (QID) | RECTAL | Status: DC | PRN

## 2012-08-01 MED ORDER — CEFPODOXIME PROXETIL 200 MG PO TABS
200.0000 mg | ORAL_TABLET | Freq: Two times a day (BID) | ORAL | Status: DC
  Administered 2012-08-01: 200 mg via ORAL
  Filled 2012-08-01 (×4): qty 1

## 2012-08-01 MED ORDER — SODIUM CHLORIDE 0.9 % IV BOLUS (SEPSIS)
500.0000 mL | Freq: Once | INTRAVENOUS | Status: AC
  Administered 2012-08-01: 500 mL via INTRAVENOUS

## 2012-08-01 MED ORDER — NITROGLYCERIN 0.4 MG SL SUBL: 0.4000 mg | SUBLINGUAL_TABLET | SUBLINGUAL | Status: DC | PRN

## 2012-08-01 MED ORDER — ATORVASTATIN CALCIUM 10 MG PO TABS: 10.0000 mg | ORAL_TABLET | Freq: Every day | ORAL | Status: DC

## 2012-08-01 MED ORDER — SODIUM CHLORIDE 0.9 % IV SOLN
INTRAVENOUS | Status: DC
  Administered 2012-08-01: 500 mL via INTRAVENOUS

## 2012-08-01 MED ORDER — GI COCKTAIL ~~LOC~~
30.0000 mL | Freq: Once | ORAL | Status: AC
  Administered 2012-08-01: 30 mL via ORAL
  Filled 2012-08-01: qty 30

## 2012-08-01 NOTE — H&P (Signed)
[facility] History and Physical  Taylor Koch JYN:[id number] DOB: [birth year] DOA: 08/01/2012  Referring physician:  PCP: Dwana Melena, MD  Specialists:   Chief Complaint: chest pain  HPI: Taylor Koch is a 62 y.o. female with past medical hx of COPD, anxiety, HTN, GERD, DDD,  breas cancer,  Vertigo, thoracic outlet syndrome cervical disc surgery 2000 present to ED this am with cc CP. Information is obtained from patient. States she has had intermittent headache and dizziness for last 3-4 days. 2 days ago experienced brief episode of SOB that quickly resolved. This am she awakened with left anterior chest pain that radiated to left side of neck. In addition she experienced left face tingling and left arm tingling. Denies left arm weakness.  Associated symptoms include nausea no vomiting, diaphoresis. No SOB, palpitation, slurred speech, difficulty swallowing. Pt denies recent illness, fever, chills, abdominal pain, diarrhea, constipation, melena, dysuria, hematuria. Pt describes the pain initially as sharp and then changed to "pressure/heaviness". She states that she proceeded to get ready for her day and when the symptoms did not subside she decided to come to Ed.  Reports improved pain since NTG given in ED. Reports less tingling left arm in ED.  Work up in ED yield one negative troponin, EKG NSR, mild hypokalemia. Symptoms came on suddenly have persisted characterized as moderate. We are asked to admit.    Review of Systems: The patient denies anorexia, fever, weight loss,, vision loss, decreased hearing, hoarseness, balance deficits, hemoptysis, abdominal pain, melena, hematochezia, severe indigestion/heartburn, hematuria, incontinence, genital sores, muscle weakness, suspicious skin lesions, transient blindness, difficulty walking, depression, unusual weight change, abnormal bleeding, enlarged lymph nodes, angioedema, and breast masses.    Past Medical History  Diagnosis Date  .  Fracture of sternum   . Thoracic outlet syndrome     left  . Hypertension   . GERD (gastroesophageal reflux disease)   . Cancer   . Lymphedema of arm     intermittent, left  . COPD (chronic obstructive pulmonary disease)   . Anxiety   . Vertigo   . Hypercholesterolemia   . Diverticulosis   . DDD (degenerative disc disease), cervical   . DDD (degenerative disc disease), lumbar   . Chronic neck pain    Past Surgical History  Procedure Laterality Date  . Breast lumpectomy      left breast  . Lymph node removal      right arm  . Appendectomy    . Cervical spine surgery    . Oophorectomy    . Other surgical history      rib removed from left ribs  . Thoracic outlet surgery Left 08/2011    [facility], Dr. Ewing Schlein  . Cervical spine surgery     Social History:  reports that she has never smoked. She does not have any smokeless tobacco history on file. She reports that she does not drink alcohol. Her drug history is not on file. Independent with ADL's. Lives with husband, employed at bank.   Allergies  Allergen Reactions  . Hydrocodone Itching and Other (See Comments)    Severe headaches, too  . Penicillins Hives    Family History  Problem Relation Age of Onset  . Cancer Mother   . Diabetes Father   . Heart failure Father    Mother deceased at 91 pmhx + CAD, HTN  Father deceased 48 CHF melenoma CHF  Prior to Admission medications   Medication Sig Start Date End Date Taking? Authorizing Provider  ALPRAZolam (XANAX) 0.25 MG tablet Take 0.25 mg by mouth 3 (three) times daily as needed for anxiety (patient states she usually only take 2 tablets a day).   Yes Historical Provider, MD  hydrochlorothiazide (HYDRODIURIL) 25 MG tablet Take 25 mg by mouth daily.   Yes Historical Provider, MD  olmesartan (BENICAR) 20 MG tablet Take 20 mg by mouth daily.   Yes Historical Provider, MD  Omeprazole-Sodium Bicarbonate (ZEGERID) 20-1100 MG CAPS Take 1 capsule by mouth daily before breakfast.    Yes Historical Provider, MD  propranolol (INDERAL) 10 MG tablet Take 10 mg by mouth 2 (two) times daily.   Yes Historical Provider, MD  pseudoephedrine (SUDAFED) 30 MG tablet Take 30 mg by mouth daily. OTC medication   Yes Historical Provider, MD  raloxifene (EVISTA) 60 MG tablet Take 1 tablet (60 mg total) by mouth daily. 11/03/11  Yes Lowella Dell, MD  rosuvastatin (CRESTOR) 20 MG tablet Take 20 mg by mouth daily.   Yes Historical Provider, MD  Calcium Carbonate (CALTRATE 600 PO) Take 2 tablets by mouth daily.    Historical Provider, MD  ergocalciferol (VITAMIN D2) 50000 UNITS capsule Take 50,000 Units by mouth every 14 (fourteen) days.     Historical Provider, MD   Physical Exam: Filed Vitals:   08/01/12 0913 08/01/12 0921 08/01/12 1025 08/01/12 1311  BP: 104/62 97/52 99/51  140/77  Pulse:   76 79  Temp:      TempSrc:      Resp:   17 18  Height:      Weight:      SpO2:   100% 100%     General:  Awake alert NAD  Eyes: PERRL EOMI  ENT: ears clear, no nasal drainage, mucus membrane mouth moist, pink  Neck: supple no JVD full rom  Cardiovascular: RRR No MGR No LEE PPP  Respiratory: normal effort BSCTAB no rhonchi/wheeze  Abdomen: round soft +BS non-tender to palpation  Skin: warm/dry no rash lesion  Musculoskeletal: MAE no joint swelling erythemal non-tender to palpation  Psychiatric: calm cooperative appropriate  Neurologic: cranial nerve II-XII intact Bilateral UE strength 5/5  Labs on Admission:  Basic Metabolic Panel:  Recent Labs Lab 08/01/12 0800  NA 137  K 2.9*  CL 96  CO2 28  GLUCOSE 156*  BUN 18  CREATININE 0.96  CALCIUM 9.9   Liver Function Tests: No results found for this basename: AST, ALT, ALKPHOS, BILITOT, PROT, ALBUMIN,  in the last 168 hours No results found for this basename: LIPASE, AMYLASE,  in the last 168 hours No results found for this basename: AMMONIA,  in the last 168 hours CBC:  Recent Labs Lab 08/01/12 0800  WBC 8.5   NEUTROABS 6.4  HGB 13.2  HCT 37.5  MCV 89.3  PLT 220   Cardiac Enzymes:  Recent Labs Lab 08/01/12 0800 08/01/12 1131  TROPONINI <0.30 <0.30    BNP (last 3 results) No results found for this basename: PROBNP,  in the last 8760 hours CBG: No results found for this basename: GLUCAP,  in the last 168 hours  Radiological Exams on Admission: Mr Brain Wo Contrast  08/01/2012  *RADIOLOGY REPORT*  Clinical Data: Left-sided facial numbness.  Headache.  History of breast cancer.  MRI HEAD WITHOUT CONTRAST  Technique:  Multiplanar, multiecho pulse sequences of the brain and surrounding structures were obtained according to standard protocol without intravenous contrast.  Comparison: CT head 02/17/2010.  Findings: There is no evidence for acute infarction, intracranial hemorrhage, mass lesion,  hydrocephalus, or extra-axial fluid. There is no atrophy or white matter disease.  Basal ganglia mineralization is physiologic.  No post treatment sequelae visible in the white matter status post radiation and chemotherapy.  Major intracranial vascular structures widely patent.  Normal pituitary and cerebellar tonsils.  Craniocervical junction unremarkable. Mild tonsillar ectopia without frank Chiari I malformation.  No worrisome osseous lesions. Incidental left posterior frontoparietal venous angioma drains superiorly.  Paranasal sinuses and orbits are unremarkable.  There is minimal asymmetric left mastoid fluid without signs of mastoiditis or osseous destructive process.  Subtemporal region unremarkable.  Compared with prior head CT there is no significant change.  IMPRESSION: Unremarkable cranial MRI.  No acute or focal intracranial abnormality.   Original Report Authenticated By: Davonna Belling, M.D.    Dg Chest Portable 1 View  08/01/2012  *RADIOLOGY REPORT*  Clinical Data: Chest pain  PORTABLE CHEST - 1 VIEW  Comparison: 02/17/2010  Findings: Heart size upper normal.  Negative for heart failure. Negative for  infiltrate or effusion.  Lungs are clear and there is no mass or adenopathy.  IMPRESSION: No acute abnormality.   Original Report Authenticated By: Janeece Riggers, M.D.     EKG: Independently reviewed. NSR  Assessment/Plan Principal Problem:   Chest pain, atypical: will admit to obs/tele for rule out. D-dimer neg. Chest xray without acute abnormality. EKG NSR.  Will cycle troponins. Repeat EKG in am. Provide NTG, Morphine as needed for pain. Pain improved on admission but continues with "pressure" sensation.  Support with gently IV fluids and oxygen as needed. ASA. Pt denies any history of cardiac work up.  Active Problems:   Left face and arm tingling: pt also hx cervical disc dis s/p disc repair 2000. Will check MRI. Continue statin. Check FLP    Hypokalemia: replete and recheck    Hypertension: fair control. Of note BP did drop to 97 after NTG in ED. Given small IV fluid bolus. Will hold HCTZ for now. Continue home imdur, benicar. Monitor    GERD (gastroesophageal reflux disease): see #1. Will continue PPI    COPD (chronic obstructive pulmonary disease): at baseline. No wheeze.     Anxiety: pt reports anxiety level higher than usual recently. 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 Twin sister recently with recurrence of cancer. Will continue home xanax.       Code Status: full Family Communication: husband and daughter at bedside Disposition Plan: home when ready, hopefully tomorrow.   Time spent: 60 minutes  [facility] [place] [facility]   If 7PM-7AM, please contact night-coverage [email or web address] Password [facility] 08/01/2012, 2:05 PM  Patient interviewed and examined independently. Chest pain is atypical. Somewhat reproducible. She has a mild chest pain currently. Will try Toradol. Rule out MI. If she rules out, can followup with cardiology for outpatient stress test. She is complaining of neck pain and ear pain.  Tympanic membrane is slightly red, but no bulging or effusion noted. Will give Cefpodoxime for otitis media. Heat pack to neck.  Crista Curb, M.D.

## 2012-08-01 NOTE — ED Provider Notes (Signed)
History     CSN: [id number]  Arrival date & time 08/01/12  4098   First MD Initiated Contact with Patient 08/01/12 [phone]      Chief Complaint  Patient presents with  . Chest Pain     HPI Pt was seen at 0815.   Per pt, c/o gradual onset and persistence of constant left sided chest "pain" that began this morning when she woke up, approx 0500.  Pt describes the pain as first "shooting," then "pressure" and "heaviness."  Has been associated with SOB. Describes the pain as radiating into her left neck and arm making her arm "feel numb."  Pt also c/o left ear pain and left facial "numbness" that has been present this past week.  States she has hx of same, thinks it is "allergies."  Denies palpitations, no cough, no back pain, no focal motor weakness, no fevers, no abd pain, no N/V/D, no slurred speech, no visual changes, no facial droop.        Past Medical History  Diagnosis Date  . Fracture of sternum   . Thoracic outlet syndrome     left  . Hypertension   . GERD (gastroesophageal reflux disease)   . Cancer   . Lymphedema of arm     intermittent, left  . COPD (chronic obstructive pulmonary disease)   . Anxiety   . Vertigo   . Hypercholesterolemia   . Diverticulosis   . DDD (degenerative disc disease), cervical   . DDD (degenerative disc disease), lumbar   . Chronic neck pain     Past Surgical History  Procedure Laterality Date  . Breast lumpectomy      left breast  . Lymph node removal      right arm  . Appendectomy    . Cervical spine surgery    . Oophorectomy    . Other surgical history      rib removed from left ribs  . Thoracic outlet surgery Left 08/2011    [facility], Dr. Ewing Schlein  . Cervical spine surgery      Family History  Problem Relation Age of Onset  . Cancer Mother   . Diabetes Father   . Heart failure Father     History  Substance Use Topics  . Smoking status: Never Smoker   . Smokeless tobacco: Not on file  . Alcohol Use: No    Review of  Systems ROS: Statement: All systems negative except as marked or noted in the HPI; Constitutional: Negative for fever and chills. ; ; Eyes: Negative for eye pain, redness and discharge. ; ; ENMT: +left ear pain. Negative for hoarseness, nasal congestion, sinus pressure and sore throat. ; ; Cardiovascular: +CP, SOB. Negative for palpitations, diaphoresis, dyspnea and peripheral edema. ; ; Respiratory: Negative for cough, wheezing and stridor. ; ; Gastrointestinal: Negative for nausea, vomiting, diarrhea, abdominal pain, blood in stool, hematemesis, jaundice and rectal bleeding. . ; ; Genitourinary: Negative for dysuria, flank pain and hematuria. ; ; Musculoskeletal: Negative for back pain and neck pain. Negative for swelling and trauma.; ; Skin: Negative for pruritus, rash, abrasions, blisters, bruising and skin lesion.; ; Neuro: +paresthesias.  Negative for headache, lightheadedness and neck stiffness. Negative for weakness, altered level of consciousness , altered mental status, extremity weakness, involuntary movement, seizure and syncope.     Allergies  Hydrocodone and Penicillins  Home Medications   Current Outpatient Rx  Name  Route  Sig  Dispense  Refill  . ALPRAZolam (XANAX) 0.25 MG tablet  Oral   Take 0.25 mg by mouth 3 (three) times daily as needed for anxiety (patient states she usually only take 2 tablets a day).         . hydrochlorothiazide (HYDRODIURIL) 25 MG tablet   Oral   Take 25 mg by mouth daily.         Marland Kitchen olmesartan (BENICAR) 20 MG tablet   Oral   Take 20 mg by mouth daily.         Maxwell Caul Bicarbonate (ZEGERID) 20-1100 MG CAPS   Oral   Take 1 capsule by mouth daily before breakfast.         . propranolol (INDERAL) 10 MG tablet   Oral   Take 10 mg by mouth 2 (two) times daily.         . pseudoephedrine (SUDAFED) 30 MG tablet   Oral   Take 30 mg by mouth daily. OTC medication         . raloxifene (EVISTA) 60 MG tablet   Oral   Take 1  tablet (60 mg total) by mouth daily.   90 tablet   12   . rosuvastatin (CRESTOR) 20 MG tablet   Oral   Take 20 mg by mouth daily.         . Calcium Carbonate (CALTRATE 600 PO)   Oral   Take 2 tablets by mouth daily.         . ergocalciferol (VITAMIN D2) 50000 UNITS capsule   Oral   Take 50,000 Units by mouth every 14 (fourteen) days.            BP 115/67  Pulse 89  Temp(Src) 98 F (36.7 C) (Oral)  Resp 18  Ht 5' 6.5" (1.689 m)  Wt 194 lb (87.998 kg)  BMI 30.85 kg/m2  SpO2 99%  Physical Exam 0820: Physical examination:  Nursing notes reviewed; Vital signs and O2 SAT reviewed;  Constitutional: Well developed, Well nourished, Well hydrated, In no acute distress; Head:  Normocephalic, atraumatic; Eyes: EOMI, PERRL, No scleral icterus; ENMT: TM's clear bilat. +edemetous nasal turbinates bilat with clear rhinorrhea.  Mouth and pharynx normal, Mucous membranes moist; Neck: Supple, Full range of motion, No lymphadenopathy; Cardiovascular: Regular rate and rhythm, No gallop; Respiratory: Breath sounds clear & equal bilaterally, No rales, rhonchi, wheezes.  Speaking full sentences with ease, Normal respiratory effort/excursion; Chest: Nontender, Movement normal; Abdomen: Soft, Nontender, Nondistended, Normal bowel sounds; Genitourinary: No CVA tenderness; Spine:  No midline CS, TS, LS tenderness.;; Extremities: Pulses normal/equal bilat, No tenderness, No edema, No calf edema or asymmetry.; Neuro: AA&Ox3, Major CN grossly intact.  Strength 5/5 equal bilat UE's and LE's.  DTR 2/4 equal bilat UE's and LE's. +entire left face with subjective decreased sensation compared to right, otherwise no gross sensory deficits.  Normal cerebellar testing bilat UE's (finger-nose) and LE's (heel-shin). Speech clear.  No facial droop.;; Skin: Color normal, Warm, Dry.; Psych:  Anxious, poor eye contact with HPI given to me with her eyes closed.    ED Course  Procedures    MDM  MDM Reviewed: previous  chart, nursing note and vitals Reviewed previous: CT scan, labs, ECG and MRI Interpretation: labs, ECG, x-ray and CT scan    Date: 08/01/2012  Rate: 97  Rhythm: normal sinus rhythm and premature ventricular contractions (PVC)  QRS Axis: normal  Intervals: normal  ST/T Wave abnormalities: normal  Conduction Disutrbances:none  Narrative Interpretation:   Old EKG Reviewed: unchanged; no significant changes from previous EKG dated 02/17/2010.  Results for orders placed during the hospital encounter of 08/01/12  CBC WITH DIFFERENTIAL      Result Value Range   WBC 8.5  4.0 - 10.5 K/uL   RBC 4.20  3.87 - 5.11 MIL/uL   Hemoglobin 13.2  12.0 - 15.0 g/dL   HCT 16.1  09.6 - 04.5 %   MCV 89.3  78.0 - 100.0 fL   MCH 31.4  26.0 - 34.0 pg   MCHC 35.2  30.0 - 36.0 g/dL   RDW 40.9  81.1 - 91.4 %   Platelets 220  150 - 400 K/uL   Neutrophils Relative 76  43 - 77 %   Neutro Abs 6.4  1.7 - 7.7 K/uL   Lymphocytes Relative 17  12 - 46 %   Lymphs Abs 1.4  0.7 - 4.0 K/uL   Monocytes Relative 6  3 - 12 %   Monocytes Absolute 0.5  0.1 - 1.0 K/uL   Eosinophils Relative 1  0 - 5 %   Eosinophils Absolute 0.1  0.0 - 0.7 K/uL   Basophils Relative 0  0 - 1 %   Basophils Absolute 0.0  0.0 - 0.1 K/uL  BASIC METABOLIC PANEL      Result Value Range   Sodium 137  135 - 145 mEq/L   Potassium 2.9 (*) 3.5 - 5.1 mEq/L   Chloride 96  96 - 112 mEq/L   CO2 28  19 - 32 mEq/L   Glucose, Bld 156 (*) 70 - 99 mg/dL   BUN 18  6 - 23 mg/dL   Creatinine, Ser 7.82  0.50 - 1.10 mg/dL   Calcium 9.9  8.4 - 95.6 mg/dL   GFR calc non Af Amer 63 (*) >90 mL/min   GFR calc Af Amer 73 (*) >90 mL/min  TROPONIN I      Result Value Range   Troponin I <0.30  <0.30 ng/mL  D-DIMER, QUANTITATIVE      Result Value Range   D-Dimer, Quant 0.40  0.00 - 0.48 ug/mL-FEU  TROPONIN I      Result Value Range   Troponin I <0.30  <0.30 ng/mL   Mr Brain Wo Contrast 08/01/2012  *RADIOLOGY REPORT*  Clinical Data: Left-sided facial numbness.   Headache.  History of breast cancer.  MRI HEAD WITHOUT CONTRAST  Technique:  Multiplanar, multiecho pulse sequences of the brain and surrounding structures were obtained according to standard protocol without intravenous contrast.  Comparison: CT head 02/17/2010.  Findings: There is no evidence for acute infarction, intracranial hemorrhage, mass lesion, hydrocephalus, or extra-axial fluid. There is no atrophy or white matter disease.  Basal ganglia mineralization is physiologic.  No post treatment sequelae visible in the white matter status post radiation and chemotherapy.  Major intracranial vascular structures widely patent.  Normal pituitary and cerebellar tonsils.  Craniocervical junction unremarkable. Mild tonsillar ectopia without frank Chiari I malformation.  No worrisome osseous lesions. Incidental left posterior frontoparietal venous angioma drains superiorly.  Paranasal sinuses and orbits are unremarkable.  There is minimal asymmetric left mastoid fluid without signs of mastoiditis or osseous destructive process.  Subtemporal region unremarkable.  Compared with prior head CT there is no significant change.  IMPRESSION: Unremarkable cranial MRI.  No acute or focal intracranial abnormality.   Original Report Authenticated By: Davonna Belling, M.D.    Dg Chest Portable 1 View 08/01/2012  *RADIOLOGY REPORT*  Clinical Data: Chest pain  PORTABLE CHEST - 1 VIEW  Comparison: 02/17/2010  Findings: Heart size upper normal.  Negative for heart failure. Negative for infiltrate or effusion.  Lungs are clear and there is no mass or adenopathy.  IMPRESSION: No acute abnormality.   Original Report Authenticated By: Janeece Riggers, M.D.     1205:  Pt reassured regarding her left face "numbness."  Pt's CP improving after ntg and morphine.  IVF given for drop in SBP after ntg.  Dx and testing d/w pt and family.  Questions answered.  Verb understanding, agreeable to observation admit.  T/C to [facility] Lendell Caprice, case discussed,  including:  HPI, pertinent PM/SHx, VS/PE, dx testing, ED course and treatment:  Agreeable to observation admit, requests to write temporary orders, obtain tele bed to team 1.          Laray Anger, DO 08/02/12 1523

## 2012-08-01 NOTE — ED Notes (Addendum)
Pt presents to er with mid center chest pain that radiates to left arm and left facial area. Pain is associated with sob, nausea, dizziness, weakness, pain started during the night and woke pt up from sleep, pain has remained constant and is described as a pressure. Pt appears anxious in exam room

## 2012-08-02 DIAGNOSIS — I10 Essential (primary) hypertension: Secondary | ICD-10-CM

## 2012-08-02 LAB — BASIC METABOLIC PANEL
BUN: 22 mg/dL (ref 6–23)
Creatinine, Ser: 0.82 mg/dL (ref 0.50–1.10)
GFR calc Af Amer: 88 mL/min — ABNORMAL LOW (ref >90)
GFR calc non Af Amer: 76 mL/min — ABNORMAL LOW (ref >90)
Potassium: 3.4 mEq/L — ABNORMAL LOW (ref 3.5–5.1)

## 2012-08-02 LAB — CBC
MCHC: 34.3 g/dL (ref 30.0–36.0)
RDW: 12.7 % (ref 11.5–15.5)

## 2012-08-02 MED ORDER — CEFPODOXIME PROXETIL 200 MG PO TABS
200.0000 mg | ORAL_TABLET | Freq: Two times a day (BID) | ORAL | Status: DC
  Filled 2012-08-02 (×3): qty 1

## 2012-08-02 MED ORDER — POTASSIUM CHLORIDE CRYS ER 20 MEQ PO TBCR
40.0000 meq | EXTENDED_RELEASE_TABLET | Freq: Once | ORAL | Status: AC
  Administered 2012-08-02: 40 meq via ORAL
  Filled 2012-08-02: qty 2

## 2012-08-02 MED ORDER — DIPHENHYDRAMINE HCL 50 MG/ML IJ SOLN
25.0000 mg | Freq: Once | INTRAMUSCULAR | Status: AC
  Administered 2012-08-02: 25 mg via INTRAVENOUS
  Filled 2012-08-02: qty 1

## 2012-08-02 MED ORDER — POTASSIUM CHLORIDE ER 10 MEQ PO TBCR: 10.0000 meq | EXTENDED_RELEASE_TABLET | Freq: Two times a day (BID) | ORAL | Status: DC

## 2012-08-02 MED ORDER — ASPIRIN 325 MG PO TBEC: 325.0000 mg | DELAYED_RELEASE_TABLET | Freq: Every day | ORAL | Status: AC

## 2012-08-02 MED ORDER — CEFPODOXIME PROXETIL 200 MG PO TABS: 200.0000 mg | ORAL_TABLET | Freq: Two times a day (BID) | ORAL | Status: DC

## 2012-08-02 MED ORDER — DIPHENHYDRAMINE HCL 25 MG PO CAPS: 50.0000 mg | ORAL_CAPSULE | Freq: Four times a day (QID) | ORAL | Status: DC | PRN

## 2012-08-02 NOTE — Discharge Summary (Signed)
Physician Discharge Summary  Taylor Koch ZOX:[id number] DOB: 05-Feb-1951 DOA: 08/01/2012  PCP: Dwana Melena, MD  Admit date: 08/01/2012 Discharge date: 08/02/2012  Time spent: 30  minutes  Recommendations for Outpatient Follow-up:  1. To be scheduled for OP stress test with [facility] cardiology.  08/12/12 2. Has appointment with GYN 08/04/12 and will request BMET for evaluation of potassium level 3. Follow up with PCP 1 week to evaluate lower extremity edema and need for HCTZ and potassium supplement  Discharge Diagnoses:  Principal Problem:   Chest pain, atypical Active Problems:   Left face and arm tingling   Hypokalemia   Hypertension   GERD (gastroesophageal reflux disease)   COPD (chronic obstructive pulmonary disease)   Anxiety   Otitis media of left ear   Discharge Condition: stable   Diet recommendation: heart healthy  Filed Weights   08/01/12 0752 08/01/12 1500  Weight: 87.998 kg (194 lb) 91.536 kg (201 lb 12.8 oz)    History of present illness:  Taylor Koch is a 62 y.o. female with past medical hx of COPD, anxiety, HTN, GERD, DDD, breast cancer, Vertigo, thoracic outlet syndrome cervical disc surgery 2000 presented to ED 08/01/12 with cc CP. Information obtained from patient. Stated she had intermittent headache and dizziness for previous 3-4 days. 2 days prior experienced brief episode of SOB that quickly resolved.  On 08/01/12 she awakened with left anterior chest pain that radiated to left side of neck. In addition she experienced left face tingling and left arm tingling. Denied left arm weakness. Associated symptoms included nausea no vomiting, diaphoresis. No SOB, palpitation, slurred speech, difficulty swallowing. Pt denied recent illness, fever, chills, abdominal pain, diarrhea, constipation, melena, dysuria, hematuria. Pt described the pain initially as sharp and then changed to "pressure/heaviness". She stated that she proceeded to get ready for her day and when the  symptoms did not subside she decided to come to Ed. Reported improved pain since NTG given in ED. Reported less tingling left arm in ED. Work up in ED yield one negative troponin, EKG NSR, mild hypokalemia. Symptoms came on suddenly, persisted, characterized as moderate. We were asked to admit.      Hospital Course:  Chest pain, atypical: Admited to obs/tele for rule out. D-dimer neg. Chest xray without acute abnormality. EKG NSR. Troponins neg x3. Repeat EKG NSR. Triglyceride 199, LDL 96.  Pain somewhat reproducible. Given toradol and developed itch without rash and some relief in pain. Started on asa and will  continue statin. On day of discharge pain improved. Will be scheduled for OP stress test.   Active Problems:  Left face and arm tingling: pt also hx cervical disc dis s/p disc repair 2000. MRI brain without acute abnormality . Continue statin. Triglyceride 199 LDL 96. Improved at discharge.  Hypokalemia: likely related to daily HCTZ patient takes for LEE. Will discontinue at discharge. On discharge potassium level 3.4. Will replete at discharge and provide kdur for 2 days additional. Recommend follow up BMET on 08/04/12 at pts yearly GYN appointment to evaluate potassium level. Recommend follow up with PCP 1 week to evaluate LEE and need for daily HCTZ.   Hypertension: controlled. Of note BP did drop to 97 after NTG in ED. Given small IV fluid bolus. HCTZ held due to #3. Conttinue home imdur, benicar.   Otis Media: given cefpodoxime. Will complete 7 day course  GERD (gastroesophageal reflux disease): see #1. Stable during hospitalization   COPD (chronic obstructive pulmonary disease): at baseline during this hospitalization. No  wheeze.   Anxiety: stable during this hospitalization.    Procedures: none Consultations:  none  Discharge Exam: Filed Vitals:   08/01/12 1425 08/01/12 1500 08/01/12 2107 08/02/12 0438  BP: 121/74 129/70 102/67 95/50  Pulse: 67 57 68 57  Temp:  97.1 F  (36.2 C) 98.2 F (36.8 C) 97.5 F (36.4 C)  TempSrc:  Oral Oral Oral  Resp: 16 18 18 17   Height:  5\' 6"  (1.676 m)    Weight:  91.536 kg (201 lb 12.8 oz)    SpO2: 98% 98% 94% 96%    General: awake alert oriented x3 Cardiovascular: RRR no MGR Trace LEE PPP Respiratory: normal effort BSCTAB No rhonchi, wheeze  Discharge Instructions  Discharge Orders   Future Orders Complete By Expires     Call MD for:  difficulty breathing, headache or visual disturbances  As directed     Call MD for:  persistant nausea and vomiting  As directed     Diet - low sodium heart healthy  As directed     Discharge instructions  As directed     Comments:      Complete antibiotics as prescribed Complete Kdur as perscribed Recommend requesting BMET at GYN appointment that is already scheduled 08/04/12 to evaluate potassium level Follow up with PCP 1 week as BP meds adjusted. Recommend evaluation for optimal control    Increase activity slowly  As directed         Medication List    STOP taking these medications       hydrochlorothiazide 25 MG tablet  Commonly known as:  HYDRODIURIL      TAKE these medications       ALPRAZolam 0.25 MG tablet  Commonly known as:  XANAX  Take 0.25 mg by mouth 3 (three) times daily as needed for anxiety (patient states she usually only take 2 tablets a day).     aspirin 325 MG EC tablet  Take 1 tablet (325 mg total) by mouth daily.     CALTRATE 600 PO  Take 2 tablets by mouth daily.     cefpodoxime 200 MG tablet  Commonly known as:  VANTIN  Take 1 tablet (200 mg total) by mouth every 12 (twelve) hours.     ergocalciferol 50000 UNITS capsule  Commonly known as:  VITAMIN D2  Take 50,000 Units by mouth every 14 (fourteen) days.     olmesartan 20 MG tablet  Commonly known as:  BENICAR  Take 20 mg by mouth daily.     Omeprazole-Sodium Bicarbonate 20-1100 MG Caps  Commonly known as:  ZEGERID  Take 1 capsule by mouth daily before breakfast.     potassium  chloride 10 MEQ tablet  Commonly known as:  K-DUR  Take 1 tablet (10 mEq total) by mouth 2 (two) times daily.     propranolol 10 MG tablet  Commonly known as:  INDERAL  Take 10 mg by mouth 2 (two) times daily.     pseudoephedrine 30 MG tablet  Commonly known as:  SUDAFED  Take 30 mg by mouth daily. OTC medication     raloxifene 60 MG tablet  Commonly known as:  EVISTA  Take 1 tablet (60 mg total) by mouth daily.     rosuvastatin 20 MG tablet  Commonly known as:  CRESTOR  Take 20 mg by mouth daily.           Follow-up Information   Follow up with [facility], MD In 1 week. (evaluate LEE as  HCTZ discontinued this hospitalization. In addition, pt to be scheduled for OP stress test. )    Contact information:   [address] [place] [address] [phone]      [facility] for stress test  The results of significant diagnostics from this hospitalization (including imaging, microbiology, ancillary and laboratory) are listed below for reference.    Significant Diagnostic Studies: Mr Brain Wo Contrast  08/01/2012  *RADIOLOGY REPORT*  Clinical Data: Left-sided facial numbness.  Headache.  History of breast cancer.  MRI HEAD WITHOUT CONTRAST  Technique:  Multiplanar, multiecho pulse sequences of the brain and surrounding structures were obtained according to standard protocol without intravenous contrast.  Comparison: CT head 02/17/2010.  Findings: There is no evidence for acute infarction, intracranial hemorrhage, mass lesion, hydrocephalus, or extra-axial fluid. There is no atrophy or white matter disease.  Basal ganglia mineralization is physiologic.  No post treatment sequelae visible in the white matter status post radiation and chemotherapy.  Major intracranial vascular structures widely patent.  Normal pituitary and cerebellar tonsils.  Craniocervical junction unremarkable. Mild tonsillar ectopia without frank Chiari I malformation.  No worrisome osseous lesions.  Incidental left posterior frontoparietal venous angioma drains superiorly.  Paranasal sinuses and orbits are unremarkable.  There is minimal asymmetric left mastoid fluid without signs of mastoiditis or osseous destructive process.  Subtemporal region unremarkable.  Compared with prior head CT there is no significant change.  IMPRESSION: Unremarkable cranial MRI.  No acute or focal intracranial abnormality.   Original Report Authenticated By: Davonna Belling, M.D.    Dg Chest Portable 1 View  08/01/2012  *RADIOLOGY REPORT*  Clinical Data: Chest pain  PORTABLE CHEST - 1 VIEW  Comparison: 02/17/2010  Findings: Heart size upper normal.  Negative for heart failure. Negative for infiltrate or effusion.  Lungs are clear and there is no mass or adenopathy.  IMPRESSION: No acute abnormality.   Original Report Authenticated By: Janeece Riggers, M.D.     Microbiology: No results found for this or any previous visit (from the past 240 hour(s)).   Labs: Basic Metabolic Panel:  Recent Labs Lab 08/01/12 0800 08/02/12 0501  NA 137 140  K 2.9* 3.4*  CL 96 103  CO2 28 27  GLUCOSE 156* 92  BUN 18 22  CREATININE 0.96 0.82  CALCIUM 9.9 9.4   Liver Function Tests: No results found for this basename: AST, ALT, ALKPHOS, BILITOT, PROT, ALBUMIN,  in the last 168 hours No results found for this basename: LIPASE, AMYLASE,  in the last 168 hours No results found for this basename: AMMONIA,  in the last 168 hours CBC:  Recent Labs Lab 08/01/12 0800 08/02/12 0501  WBC 8.5 5.7  NEUTROABS 6.4  --   HGB 13.2 11.6*  HCT 37.5 33.8*  MCV 89.3 91.8  PLT 220 184   Cardiac Enzymes:  Recent Labs Lab 08/01/12 0800 08/01/12 1131 08/01/12 1727 08/01/12 2305  TROPONINI <0.30 <0.30 <0.30 <0.30   BNP: BNP (last 3 results) No results found for this basename: PROBNP,  in the last 8760 hours CBG: No results found for this basename: GLUCAP,  in the last 168 hours     Signed:  Gwenyth Bender  [facility] 08/02/2012, 11:02 AM  Crista Curb, M.D.

## 2012-08-02 NOTE — Progress Notes (Signed)
States understanding of discharge, prescriptions given. 

## 2012-08-02 NOTE — Progress Notes (Signed)
UR Chart Review Completed  

## 2012-08-10 ENCOUNTER — Other Ambulatory Visit: Payer: Self-pay | Admitting: Cardiology

## 2012-08-10 DIAGNOSIS — R079 Chest pain, unspecified: Secondary | ICD-10-CM

## 2012-08-12 ENCOUNTER — Encounter (HOSPITAL_COMMUNITY): Payer: Self-pay

## 2012-08-12 ENCOUNTER — Encounter (HOSPITAL_COMMUNITY)
Admission: RE | Admit: 2012-08-12 | Discharge: 2012-08-12 | Disposition: A | Discharge status: Home / Self Care | Payer: BC Managed Care – PPO | Source: Ambulatory Visit | Attending: Cardiology | Admitting: Cardiology

## 2012-08-12 ENCOUNTER — Ambulatory Visit (HOSPITAL_COMMUNITY)
Admit: 2012-08-12 | Discharge: 2012-08-12 | Disposition: A | Discharge status: Home / Self Care | Payer: BC Managed Care – PPO | Source: Ambulatory Visit | Attending: Cardiology | Admitting: Cardiology

## 2012-08-12 DIAGNOSIS — R079 Chest pain, unspecified: Secondary | ICD-10-CM

## 2012-08-12 DIAGNOSIS — R0789 Other chest pain: Secondary | ICD-10-CM

## 2012-08-12 HISTORY — DX: Unspecified asthma, uncomplicated: J45.909

## 2012-08-12 MED ORDER — SODIUM CHLORIDE 0.9 % IJ SOLN
INTRAMUSCULAR | Status: AC
  Administered 2012-08-12: 10 mL via INTRAVENOUS
  Filled 2012-08-12: qty 10

## 2012-08-12 MED ORDER — TECHNETIUM TC 99M SESTAMIBI - CARDIOLITE
30.0000 | Freq: Once | INTRAVENOUS | Status: AC | PRN
  Administered 2012-08-12: 30 via INTRAVENOUS

## 2012-08-12 MED ORDER — TECHNETIUM TC 99M SESTAMIBI - CARDIOLITE
10.0000 | Freq: Once | INTRAVENOUS | Status: AC | PRN
  Administered 2012-08-12: 09:00:00 10 via INTRAVENOUS

## 2012-08-12 NOTE — Progress Notes (Signed)
Stress Lab Nurses Notes - Taylor Koch  Taylor Koch 08/12/2012 Reason for doing test: Chest Pain Type of test: Stress Cardiolite Nurse performing test: Parke Poisson, RN Nuclear Medicine Tech: Lyndel Pleasure Echo Tech: Not Applicable MD performing test: Lovina Reach & Joni Reining NP Family MD: Dr. Dwana Melena Test explained and consent signed: yes IV started: 22g jelco, Saline lock flushed, No redness or edema and Saline lock started in radiology Symptoms: SOB & Lightheaded Treatment/Intervention: None Reason test stopped: fatigue After recovery IV was: Discontinued via X-ray tech and No redness or edema Patient to return to Nuc. Med at : 11:30 Patient discharged: Home Patient's Condition upon discharge was: stable Comments: During test peak BP 142/85 & HR 162 .  Recovery BP 109/67 & HR 93.  Symptoms resolved in recovery. Erskine Speed T

## 2012-08-17 ENCOUNTER — Encounter: Payer: BC Managed Care – PPO | Admitting: Adult Health

## 2012-08-17 NOTE — Progress Notes (Signed)
HPI Taylor Koch is a 62 year old patient of Taylor Koch we are seeing on hospital followup where she was admitted at [facility] in March of 2014 with complaints of recurrent chest pain. His multiple medical problems to include COPD, anxiety, hypertension, GERD, degenerative disc disease, vertigo, thoracic outlet syndrome, and vertigo. Prior to discharge the patient was scheduled for a stress test to rule out myocardial ischemia. This is completed on 08/12/2012 this was found to be negative for ischemia, EKG revealed no changes indicative of ischemia. Her LVEF is greater than 70%. She is here for discussion of test results and ongoing assessment.  Allergies  Allergen Reactions  . Hydrocodone Itching and Other (See Comments)    Severe headaches, too  . Penicillins Hives  . Morphine And Related     Current Outpatient Prescriptions  Medication Sig Dispense Refill  . ALPRAZolam (XANAX) 0.25 MG tablet Take 0.25 mg by mouth 3 (three) times daily as needed for anxiety (patient states she usually only take 2 tablets a day).      Marland Kitchen aspirin EC 325 MG EC tablet Take 1 tablet (325 mg total) by mouth daily.  30 tablet    . Calcium Carbonate (CALTRATE 600 PO) Take 2 tablets by mouth daily.      . cefpodoxime (VANTIN) 200 MG tablet Take 1 tablet (200 mg total) by mouth every 12 (twelve) hours.  14 tablet  0  . ergocalciferol (VITAMIN D2) 50000 UNITS capsule Take 50,000 Units by mouth every 14 (fourteen) days.       Marland Kitchen olmesartan (BENICAR) 20 MG tablet Take 20 mg by mouth daily.      Taylor Koch Bicarbonate (ZEGERID) 20-1100 MG CAPS Take 1 capsule by mouth daily before breakfast.      . potassium chloride (K-DUR) 10 MEQ tablet Take 1 tablet (10 mEq total) by mouth 2 (two) times daily.  4 tablet  0  . propranolol (INDERAL) 10 MG tablet Take 10 mg by mouth 2 (two) times daily.      . pseudoephedrine (SUDAFED) 30 MG tablet Take 30 mg by mouth daily. OTC medication      . raloxifene (EVISTA) 60 MG  tablet Take 1 tablet (60 mg total) by mouth daily.  90 tablet  12  . rosuvastatin (CRESTOR) 20 MG tablet Take 20 mg by mouth daily.       No current facility-administered medications for this visit.    Past Medical History  Diagnosis Date  . Fracture of sternum   . Thoracic outlet syndrome     left  . Hypertension   . GERD (gastroesophageal reflux disease)   . Cancer   . Lymphedema of arm     intermittent, left  . COPD (chronic obstructive pulmonary disease)   . Anxiety   . Vertigo   . Hypercholesterolemia   . Diverticulosis   . DDD (degenerative disc disease), cervical   . DDD (degenerative disc disease), lumbar   . Chronic neck pain   . Asthma     Past Surgical History  Procedure Laterality Date  . Breast lumpectomy      left breast  . Lymph node removal      right arm  . Appendectomy    . Cervical spine surgery    . Oophorectomy    . Other surgical history      rib removed from left ribs  . Thoracic outlet surgery Left 08/2011    Taylor Koch, Taylor Koch  . Cervical spine surgery  [id number]: Well developed, well nourished, in no acute distress Head: Eyes PERRLA, No xanthomas.   Normal cephalic and atramatic  Lungs: Clear bilaterally to auscultation and percussion. Heart: HRRR S1 S2, without MRG.  Pulses are 2+ & equal.            No carotid bruit. No JVD.  No abdominal bruits. No femoral bruits. Abdomen: Bowel sounds are positive, abdomen soft and non-tender without masses or                  Hernia's noted. Msk:  Back normal, normal gait. Normal strength and tone for age. Extremities: No clubbing, cyanosis or edema.  DP +1 Neuro: Alert and oriented X 3. Psych:  Good affect, responds appropriately  PHYSICAL EXAM There were no vitals taken for this visit. General: Well developed, well nourished, in no acute distress Head: Eyes PERRLA, No xanthomas.   Normal cephalic and atramatic  Lungs: Clear bilaterally to auscultation and percussion. Heart: HRRR S1 S2,  without MRG.  Pulses are 2+ & equal.            No carotid bruit. No JVD.  No abdominal bruits. No femoral bruits. Abdomen: Bowel sounds are positive, abdomen soft and non-tender without masses or                  Hernia's noted. Msk:  Back normal, normal gait. Normal strength and tone for age. Extremities: No clubbing, cyanosis or edema.  DP +1 Neuro: Alert and oriented X 3. Psych:  Good affect, responds appropriately  EKG:  ASSESSMENT AND PLAN

## 2012-09-21 ENCOUNTER — Other Ambulatory Visit: Payer: Self-pay | Admitting: *Deleted

## 2012-09-21 NOTE — Telephone Encounter (Signed)
Filled on 08/04/12 at [facility]

## 2012-11-23 ENCOUNTER — Encounter (HOSPITAL_COMMUNITY): Payer: Self-pay | Admitting: Internal Medicine

## 2012-12-27 ENCOUNTER — Other Ambulatory Visit: Payer: Self-pay | Admitting: Oncology

## 2012-12-27 DIAGNOSIS — C50919 Malignant neoplasm of unspecified site of unspecified female breast: Secondary | ICD-10-CM

## 2013-01-03 ENCOUNTER — Telehealth: Payer: Self-pay | Admitting: Obstetrics & Gynecology

## 2013-01-03 NOTE — Telephone Encounter (Signed)
Patient request referral for Breast Diagnostic mammogram.

## 2013-01-04 ENCOUNTER — Telehealth: Payer: Self-pay | Admitting: Obstetrics and Gynecology

## 2013-01-04 ENCOUNTER — Telehealth: Payer: Self-pay | Admitting: Obstetrics & Gynecology

## 2013-01-04 NOTE — Telephone Encounter (Signed)
Left message on CB# of home and cell of need to return call for her referral.

## 2013-01-04 NOTE — Telephone Encounter (Signed)
Patient ask for a referral for a Breast Diagnostic Mammogram.

## 2013-01-04 NOTE — Telephone Encounter (Signed)
Patient call in requesting that she have a referral for Breast Diagnostic Mammogram.

## 2013-01-05 ENCOUNTER — Encounter: Payer: Self-pay | Admitting: Obstetrics & Gynecology

## 2013-01-05 NOTE — Telephone Encounter (Signed)
Dr Hyacinth Meeker approved appointment for tomm.  Patient did want to let us know she will need refill on Vitamin D to [facility] if she is to continue this.

## 2013-01-05 NOTE — Telephone Encounter (Signed)
Call to [facility], confirmed last MMG was 2012 with [place].  Call to patient LMTCB.

## 2013-01-05 NOTE — Telephone Encounter (Signed)
Patient last AEX 08/04/2012 Dr. Hyacinth Meeker  EPIC Patient last mammogram 0306/2012 [facility] ; BI-RADS  1 negative. Patient called [facility] for shcedule of a mammogram but because she is having pain in her arm would need a diagostic mammogram and would need referral from our office.  Patient calling today with discomfort and pain up under arm of lymph nodes in right arm. States tender to touch. Also in left arm also. Requesting referral for diagnostic mammogram. Explained to patient due to pain in area she is having would need to be seen by a provider and get appointment for her . Patient has denied appt. Today with a provider. States she was late for work this morning due to a headache and can not miss today. states she would want to see Dr. Hyacinth Meeker. Please advise on appt. Schedule for tomorrow for patient. Routed to [place] to help with appt. Schedule for Dr.Miller.

## 2013-01-05 NOTE — Telephone Encounter (Signed)
Patient returns call, Per old chart patient reported MMG 2013 but patient now confirms she did not have one last year. Reports hx of right breast cancer. 2003  BRCA/BART negative Reports pain in right underarm "area of lymph nodes" for 4-5 weeks and is getting worse. No specific lump that she is able to tell. Declines appt today due to work, needs latest possible tomm.  Work in at 3 om 01-06-13 but she is aware I need to confirm with provider.  OK for last patient of day tomorrow?

## 2013-01-06 ENCOUNTER — Ambulatory Visit (INDEPENDENT_AMBULATORY_CARE_PROVIDER_SITE_OTHER): Payer: 59 | Admitting: Obstetrics & Gynecology

## 2013-01-06 ENCOUNTER — Other Ambulatory Visit: Payer: Self-pay | Admitting: Obstetrics & Gynecology

## 2013-01-06 ENCOUNTER — Telehealth: Payer: Self-pay | Admitting: *Deleted

## 2013-01-06 VITALS — BP 160/90 | HR 64 | Resp 16 | Ht 66.0 in | Wt 203.4 lb

## 2013-01-06 DIAGNOSIS — Z1239 Encounter for other screening for malignant neoplasm of breast: Secondary | ICD-10-CM

## 2013-01-06 DIAGNOSIS — N644 Mastodynia: Secondary | ICD-10-CM

## 2013-01-06 MED ORDER — ERGOCALCIFEROL 1.25 MG (50000 UT) PO CAPS: 50000.0000 [IU] | ORAL_CAPSULE | ORAL | Status: DC

## 2013-01-06 NOTE — Telephone Encounter (Signed)
Appointment made for patient at [facility] for August 19th @ 11:00am for diagnostic mammogram  . Patient aware of appt. Date and time [facility].

## 2013-01-10 ENCOUNTER — Encounter: Payer: Self-pay | Admitting: Obstetrics & Gynecology

## 2013-01-10 NOTE — Progress Notes (Signed)
Subjective:     Patient ID: Taylor Koch, female   DOB: 12-Oct-1950, 62 y.o.   MRN: [id number]  HPI 62 year old MWF with hx of breast cancer here for breast check.  Called to scheduled screening MMG and reported new onset of breast tenderness in axilla and along scar in right breast.  Reports this has been going on for several weeks.  She has no recent trauma, no skin changes, no nipple discharge.  Denies bruising as well.  She wants diagnostic imaging on right breast but also needs routine screening on left breast.  Review of Systems  All other systems reviewed and are negative.       Objective:   Physical Exam  Constitutional: She is oriented to person, place, and time. She appears well-developed and well-nourished.  HENT:  Head: Normocephalic and atraumatic.  Neck: Normal range of motion. Neck supple. No thyromegaly present.  Pulmonary/Chest:    Neurological: She is alert and oriented to person, place, and time.  Skin: Skin is warm and dry.  Psychiatric: She has a normal mood and affect.       Assessment:     Breast tenderness in patient with hx of breast cancer     Plan:     Diagnostic right MMG and screening left scheduled for 8/19 @ 11:00am.

## 2013-01-17 ENCOUNTER — Ambulatory Visit
Admission: RE | Admit: 2013-01-17 | Discharge: 2013-01-17 | Disposition: A | Discharge status: Home / Self Care | Payer: No Typology Code available for payment source | Source: Ambulatory Visit | Attending: Obstetrics & Gynecology | Admitting: Obstetrics & Gynecology

## 2013-01-17 DIAGNOSIS — N644 Mastodynia: Secondary | ICD-10-CM

## 2013-02-03 ENCOUNTER — Other Ambulatory Visit: Payer: Self-pay | Admitting: Obstetrics & Gynecology

## 2013-02-03 NOTE — Telephone Encounter (Signed)
Last given #30/5 RF on 08/04/12. Patient has f/u breast appointment in 10/14 and AEX scheduled in 2015. Please advise if ok to refill until AEX.

## 2013-02-09 ENCOUNTER — Encounter: Payer: Self-pay | Admitting: Obstetrics & Gynecology

## 2013-02-09 ENCOUNTER — Other Ambulatory Visit: Payer: Self-pay | Admitting: Obstetrics & Gynecology

## 2013-02-10 ENCOUNTER — Other Ambulatory Visit: Payer: Self-pay | Admitting: Obstetrics & Gynecology

## 2013-02-10 NOTE — Telephone Encounter (Signed)
RX did not print.  RX called to pharmacy.

## 2013-02-10 NOTE — Telephone Encounter (Signed)
eScribe request for refill on ALPRAZOLAM  Last filled - 08/04/12, #30 X 5 (1/2 TAB BID) Last AEX - 08/04/12 Next AEX - 10/19/13 Please advise refills.  Paper chart on your desk.

## 2013-03-03 ENCOUNTER — Ambulatory Visit: Payer: No Typology Code available for payment source | Admitting: Obstetrics & Gynecology

## 2013-03-06 ENCOUNTER — Encounter: Payer: No Typology Code available for payment source | Admitting: Obstetrics & Gynecology

## 2013-03-06 NOTE — Progress Notes (Deleted)
   Subjective:   62 y.o. Married{Race/ethnicity:17218} female presents for evaluation of {Right/left:16020} breast mass. Onset of the symptoms was{TIME UNITS:19995}. Patient sought evaluation because of {Symptoms; ROS skin/breast:30521}.  Contributing factors include {Causes; risk factors breast ca:12807}. Denies {Constitutional Sx:60209}. Patient denies hiistory of trauma, bites, or injuries. Last mammogram was {Time; 1 month to 1 year:14528}.  Previous evaluation has included{Procedures; breast eval:31381}   Review of Systems {Ros - Complete:30496}@SUBJECTIVE    Objective:   @General  appearance: {general exam:16600} Head: {head exam:30909::"Normocephalic, without obvious abnormality","atraumatic"} Neck: {neck exam:17463::"no adenopathy","no carotid bruit","no JVD","supple, symmetrical, trachea midline","thyroid not enlarged, symmetric, no tenderness/mass/nodules"} Back: {back exam:801::"symmetric, no curvature. ROM normal. No CVA tenderness."} Lungs: {lung exam:16931} Breasts: {breast exam:13139::"normal appearance, no masses or tenderness"} Heart: {heart exam:5510} Abdomen: {abdominal exam:16834}    Assessment:   @ASSESSMENT :Patient is diagnosed with {DIAGNOSES; ZOXWRU:04540}   Plan:   @PLAN : The patient {has/does not have:19846} a documented plan to follow with further care of {Diagnostic/therapeutic plan md:30626} on {DATE MONTH DAY JWJX:[id number]} 2. PLAN: FOLLOW {plan; follow-up ent:15048}

## 2013-03-06 NOTE — Progress Notes (Signed)
This encounter was created in error - please disregard.

## 2013-03-07 ENCOUNTER — Telehealth: Payer: Self-pay | Admitting: Obstetrics & Gynecology

## 2013-03-07 NOTE — Telephone Encounter (Signed)
Hi Jessica, Please waive the dnka fee for this patient then you can close the encounter. Thanks for all you do, Mervin Kung

## 2013-03-08 ENCOUNTER — Other Ambulatory Visit: Payer: Self-pay | Admitting: Obstetrics & Gynecology

## 2013-03-09 NOTE — Telephone Encounter (Signed)
eScribe request for refill on ALPRAZOLAM Last filled - 02/10/13, #30 X 0 Last AEX - 08/04/12 Next AEX - 10/19/13 Pt has breast recheck appt on 03/20/13.  Please advise refills.  Paper chart in your basket.

## 2013-03-10 NOTE — Telephone Encounter (Signed)
RX faxed

## 2013-03-16 ENCOUNTER — Other Ambulatory Visit: Payer: Self-pay | Admitting: Obstetrics & Gynecology

## 2013-03-16 NOTE — Telephone Encounter (Signed)
Last Annual Exam 08/04/12 was given Rx for Temazepam 30mg  #30w/  5 refills. ok to refill?

## 2013-03-20 ENCOUNTER — Telehealth: Payer: Self-pay | Admitting: Obstetrics & Gynecology

## 2013-03-20 ENCOUNTER — Ambulatory Visit: Payer: No Typology Code available for payment source | Admitting: Obstetrics & Gynecology

## 2013-03-20 NOTE — Telephone Encounter (Signed)
Patient was unable to get off work today for her appt. She rescheduled for 04/06/13 at 8:30am.

## 2013-04-06 ENCOUNTER — Ambulatory Visit: Payer: No Typology Code available for payment source | Admitting: Obstetrics & Gynecology

## 2013-04-07 NOTE — Telephone Encounter (Signed)
Called patient re: dnka yesterday and left message to call our office back to reschedule.

## 2013-06-12 ENCOUNTER — Other Ambulatory Visit: Payer: Self-pay | Admitting: Obstetrics & Gynecology

## 2013-06-12 NOTE — Telephone Encounter (Signed)
Last refilled: 03/08/13 #30/2 refills Last AEX: 08/04/12 #30/5 refills  Current AEX scheduled for 09/2113  Please advise.

## 2013-06-13 NOTE — Telephone Encounter (Signed)
rx printed and faxed to pharmacy 

## 2013-07-13 ENCOUNTER — Other Ambulatory Visit: Payer: Self-pay | Admitting: Obstetrics & Gynecology

## 2013-07-14 NOTE — Telephone Encounter (Signed)
Last AEX 08/04/12  Last refill 06/12/13 #30/0 refills Next appt 10/19/2013

## 2013-10-03 ENCOUNTER — Other Ambulatory Visit: Payer: Self-pay | Admitting: Obstetrics & Gynecology

## 2013-10-04 NOTE — Telephone Encounter (Signed)
Last AEX 08/04/12 Last refill 03/16/2013 #30/5 refills Next appt 10/19/2013  Patient has canceled appt and No Showed appt.   Please approve or deny Rx.

## 2013-10-06 NOTE — Telephone Encounter (Signed)
RX faxed

## 2013-10-19 ENCOUNTER — Encounter: Payer: Self-pay | Admitting: Obstetrics & Gynecology

## 2013-10-19 ENCOUNTER — Ambulatory Visit: Payer: Self-pay | Admitting: Obstetrics & Gynecology

## 2013-10-20 ENCOUNTER — Encounter: Payer: Self-pay | Admitting: Obstetrics & Gynecology

## 2013-10-30 ENCOUNTER — Other Ambulatory Visit: Payer: Self-pay | Admitting: Oncology

## 2013-11-06 ENCOUNTER — Other Ambulatory Visit: Payer: Self-pay | Admitting: Obstetrics & Gynecology

## 2013-11-06 NOTE — Telephone Encounter (Signed)
Last AEX 07/2012 Last refill 07/14/13 #30/3 refills  No future appt  Patient cancelled and No showed last 3 appointments. Please advise.

## 2013-11-15 ENCOUNTER — Other Ambulatory Visit: Payer: Self-pay | Admitting: Obstetrics & Gynecology

## 2013-11-15 NOTE — Telephone Encounter (Signed)
Last AEX 07/2012  Last refill 10/05/13 #30/3 refills   No future appt  Patient cancelled and No showed last 3 appointments.  Please advise.

## 2013-11-15 NOTE — Telephone Encounter (Signed)
She has not been seen for AEX in well over a year and cancelled multiple breast exam follow ups.  No medications until she comes for AEX.  RX declined.  She does have appt scheduled on Friday I believe but no prescription refills until then.  I'm sorry.

## 2013-11-17 ENCOUNTER — Ambulatory Visit (INDEPENDENT_AMBULATORY_CARE_PROVIDER_SITE_OTHER): Payer: 59 | Admitting: Gynecology

## 2013-11-17 ENCOUNTER — Encounter: Payer: Self-pay | Admitting: Gynecology

## 2013-11-17 VITALS — BP 118/62 | HR 72 | Ht 66.5 in | Wt 204.0 lb

## 2013-11-17 DIAGNOSIS — Z01419 Encounter for gynecological examination (general) (routine) without abnormal findings: Secondary | ICD-10-CM

## 2013-11-17 DIAGNOSIS — F4323 Adjustment disorder with mixed anxiety and depressed mood: Secondary | ICD-10-CM

## 2013-11-17 DIAGNOSIS — Z0000 Encounter for general adult medical examination without abnormal findings: Secondary | ICD-10-CM

## 2013-11-17 DIAGNOSIS — E559 Vitamin D deficiency, unspecified: Secondary | ICD-10-CM

## 2013-11-17 DIAGNOSIS — G4700 Insomnia, unspecified: Secondary | ICD-10-CM

## 2013-11-17 LAB — POCT URINALYSIS DIPSTICK
BILIRUBIN UA: NEGATIVE
GLUCOSE UA: NEGATIVE
Ketones, UA: NEGATIVE
Leukocytes, UA: NEGATIVE
NITRITE UA: NEGATIVE
PH UA: 8
Protein, UA: NEGATIVE
RBC UA: NEGATIVE
Urobilinogen, UA: NEGATIVE

## 2013-11-17 MED ORDER — TEMAZEPAM 15 MG PO CAPS: 15.0000 mg | ORAL_CAPSULE | Freq: Every day | ORAL | Status: DC

## 2013-11-17 MED ORDER — ERGOCALCIFEROL 1.25 MG (50000 UT) PO CAPS
50000.0000 [IU] | ORAL_CAPSULE | ORAL | Status: AC
Start: 2013-11-17 — End: ?

## 2013-11-17 MED ORDER — ALPRAZOLAM 1 MG PO TABS: ORAL_TABLET | ORAL | Status: DC

## 2013-11-17 NOTE — Progress Notes (Signed)
Patient ID: Taylor Koch, female   DOB: 1950-10-03, 63 y.o.   MRN: [id number] RX for TEMAZEPAM and ALPRAZOLAM faxed to [place] [place].

## 2013-11-17 NOTE — Progress Notes (Signed)
63 y.o. Married '@Caucasian'  female   [id number] here for annual exam. Pt reports no mense.  She does not report hot flashes, does not have night sweats, does not have vaginal dryness.  She is not using lubricants.  She does not report post-menopasual bleeding. Pt is doing well on Evista.  Patient's last menstrual period was 06/01/1998.          Sexually active: yes  The current method of family planning is none.    Exercising: no  The patient does not participate in regular exercise at present. Last pap:07/2011 Neg HPV Abnormal PAP: Mammogram: BiRAD 2   BSE: YES Colonoscopy: per pts, its been many years DEXA: Unsure Alcohol:No Tobacco:No  Health Maintenance  Topic Date Due  . Tetanus/tdap  07/01/1971  . Colonoscopy  06/30/2002  . Zostavax  06/30/2012  . Influenza Vaccine  12/30/2013  . Pap Smear  07/15/2014  . Mammogram  01/18/2015    Family History  Problem Relation Age of Onset  . Cancer Mother   . Diabetes Father   . Heart failure Father     Patient Active Problem List   Diagnosis Date Noted  . Chest pain, atypical 08/01/2012  . Left face and arm tingling 08/01/2012  . Hypokalemia 08/01/2012  . Otitis media of left ear 08/01/2012  . Hypertension   . GERD (gastroesophageal reflux disease)   . COPD (chronic obstructive pulmonary disease)   . Anxiety   . Breast cancer 11/03/2011    Past Medical History  Diagnosis Date  . Fracture of sternum   . Thoracic outlet syndrome     left  . Hypertension   . GERD (gastroesophageal reflux disease)   . Cancer   . Lymphedema of arm     intermittent, left  . COPD (chronic obstructive pulmonary disease)   . Anxiety   . Vertigo   . Hypercholesterolemia   . Diverticulosis   . DDD (degenerative disc disease), cervical   . DDD (degenerative disc disease), lumbar   . Chronic neck pain   . Asthma   . Hyperlipidemia   . BRCA1 negative   . BRCA2 negative     Past Surgical History  Procedure Laterality Date  . Breast  lumpectomy      left breast  . Lymph node removal      right arm  . Cervical spine surgery    . Oophorectomy    . Other surgical history      rib removed from left ribs  . Thoracic outlet surgery Left 08/2011    [facility], Dr. Elenor Quinones  . Cervical spine surgery    . Appendectomy    . Lumbar disc surgery  2001  . Laparoscopy  1/10    [facility]  . Other surgical history      Rib Removal 08/31/11    Allergies: Hydrocodone; Penicillins; and Morphine and related  Current Outpatient Prescriptions  Medication Sig Dispense Refill  . ALPRAZolam (XANAX) 1 MG tablet TAKE ONE-HALF TABLET BY MOUTH TWICE DAILY  30 tablet  3  . aspirin EC 325 MG EC tablet Take 1 tablet (325 mg total) by mouth daily.  30 tablet    . Calcium Carbonate (CALTRATE 600 PO) Take 2 tablets by mouth daily.      . ergocalciferol (VITAMIN D2) 50000 UNITS capsule Take 1 capsule (50,000 Units total) by mouth every 14 (fourteen) days.  12 capsule  4  . hydrochlorothiazide (HYDRODIURIL) 25 MG tablet Take 25 mg by mouth daily.      Marland Kitchen  olmesartan (BENICAR) 20 MG tablet Take 20 mg by mouth daily.      Earney Navy Bicarbonate (ZEGERID) 20-1100 MG CAPS Take 1 capsule by mouth daily before breakfast.      . propranolol (INDERAL) 10 MG tablet Take 10 mg by mouth 2 (two) times daily.      . pseudoephedrine (SUDAFED) 30 MG tablet Take 30 mg by mouth daily. OTC medication      . raloxifene (EVISTA) 60 MG tablet TAKE 1 TABLET DAILY  90 tablet  3  . rosuvastatin (CRESTOR) 20 MG tablet Take 20 mg by mouth daily.      . temazepam (RESTORIL) 15 MG capsule TAKE 1 CAPSULE BY MOUTH ONCE DAILY AT BEDTIME AS NEEDED FOR INSOMNIA  30 capsule  0   No current facility-administered medications for this visit.    ROS: Pertinent items are noted in HPI.  Exam:    BP 118/62  Pulse 72  Ht 5' 6.5" (1.689 m)  Wt 204 lb (92.534 kg)  BMI 32.44 kg/m2  LMP 06/01/1998 Weight change: '@WEIGHTCHANGE' @ Last 3 height recordings:  Ht Readings from Last 3  Encounters:  11/17/13 5' 6.5" (1.689 m)  01/06/13 '5\' 6"'  (1.676 m)  08/01/12 '5\' 6"'  (1.676 m)   General appearance: alert, cooperative and appears stated age Head: Normocephalic, without obvious abnormality, atraumatic Neck: no adenopathy, no carotid bruit, no JVD, supple, symmetrical, trachea midline and thyroid not enlarged, symmetric, no tenderness/mass/nodules Lungs: clear to auscultation bilaterally Breasts: normal appearance, no masses or tenderness, right retratcion c/w RT Heart: regular rate and rhythm, S1, S2 normal, no murmur, click, rub or gallop Abdomen: soft, non-tender; bowel sounds normal; no masses,  no organomegaly Extremities: extremities normal, atraumatic, no cyanosis or edema Skin: Skin color, texture, turgor normal. No rashes or lesions Lymph nodes: Cervical, supraclavicular, and axillary nodes normal. no inguinal nodes palpated Neurologic: Grossly normal   Pelvic: External genitalia:  no lesions              Urethra: normal appearing urethra with no masses, tenderness or lesions              Bartholins and Skenes: normal                 Vagina: atrophic              Cervix: normal appearance              Pap taken: no        Bimanual Exam:  Uterus:  uterus is normal size, shape, consistency and nontender                                      Adnexa:    absent                                      Rectovaginal: Confirms                                      Anus:  normal sphincter tone, no lesions  1. Routine gynecological examination mammogram counseled on breast self exam, mammography screening return annually or prn Discussed PAP guideline changes, importance of weight bearing exercises, calcium, vit D and balanced diet.   2.  Laboratory examination ordered as part of a routine general medical examination  - POCT urinalysis dipstick  3. Unspecified vitamin D deficiency  Vit D level, refill called in - ergocalciferol (VITAMIN D2) 50000 UNITS capsule; Take  1 capsule (50,000 Units total) by mouth every 14 (fourteen) days.  Dispense: 12 capsule; Refill: 4  4. Insomnia Uses nightly, aware habit forming - temazepam (RESTORIL) 15 MG capsule; Take 1 capsule (15 mg total) by mouth at bedtime.  Dispense: 30 capsule; Refill: 5  5. Adjustment disorder with mixed anxiety and depressed mood Uses daily - ALPRAZolam (XANAX) 1 MG tablet; TAKE ONE-HALF TABLET BY MOUTH TWICE DAILY  Dispense: 30 tablet; Refill: 3  An After Visit Summary was printed and given to the patient.

## 2013-11-17 NOTE — Addendum Note (Signed)
Addended by: Blanchard Kelch R on: 11/17/2013 04:39 PM   Modules accepted: Orders

## 2013-11-24 ENCOUNTER — Encounter: Payer: Self-pay | Admitting: Gynecology

## 2014-01-04 ENCOUNTER — Other Ambulatory Visit: Payer: Self-pay | Admitting: Oncology

## 2014-03-12 ENCOUNTER — Other Ambulatory Visit: Payer: Self-pay | Admitting: Gynecology

## 2014-03-12 MED ORDER — ALPRAZOLAM 1 MG PO TABS: ORAL_TABLET | ORAL | Status: DC

## 2014-03-12 NOTE — Addendum Note (Signed)
Addended by: Alfonzo Feller on: 03/12/2014 10:31 AM   Modules accepted: Orders

## 2014-03-12 NOTE — Telephone Encounter (Signed)
Last AEX: 11/17/13 Last refill:11/17/13 #30 X 2 Current AEX:11/21/14  Please advise

## 2014-03-12 NOTE — Telephone Encounter (Signed)
rx faxed

## 2014-04-02 ENCOUNTER — Encounter: Payer: Self-pay | Admitting: Gynecology

## 2014-05-02 ENCOUNTER — Telehealth: Payer: Self-pay | Admitting: Gynecology

## 2014-05-02 NOTE — Telephone Encounter (Signed)
LMTCB about canceled appointment °

## 2014-05-22 ENCOUNTER — Other Ambulatory Visit: Payer: Self-pay

## 2014-05-22 DIAGNOSIS — G4700 Insomnia, unspecified: Secondary | ICD-10-CM

## 2014-05-22 NOTE — Telephone Encounter (Signed)
Medication refill request: Restoril 15mg  Last AEX:  11/17/13 Next AEX: NS Last MMG (if hormonal medication request): NA Refill authorized: #30 days X 5

## 2014-05-23 MED ORDER — TEMAZEPAM 15 MG PO CAPS: 15.0000 mg | ORAL_CAPSULE | Freq: Every day | ORAL | Status: AC

## 2014-05-23 NOTE — Telephone Encounter (Signed)
rx has been faxed 

## 2014-06-04 ENCOUNTER — Other Ambulatory Visit: Payer: Self-pay | Admitting: *Deleted

## 2014-06-04 NOTE — Telephone Encounter (Signed)
Medication refill request: Xanax 1 mg Last AEX: 11/17/13 Next AEX: Not scheduled  Last MMG (if hormonal medication request): 01/17/13 BIRADS2:Benign  Refill authorized: 03/12/14 #30/1R. Today #30/1R?

## 2014-06-05 MED ORDER — ALPRAZOLAM 1 MG PO TABS: ORAL_TABLET | ORAL | Status: DC

## 2014-06-05 NOTE — Telephone Encounter (Signed)
RX printed signed by Dr. Sabra Heck and faxed to [facility].

## 2014-08-08 ENCOUNTER — Other Ambulatory Visit: Payer: Self-pay | Admitting: Obstetrics & Gynecology

## 2014-08-08 NOTE — Telephone Encounter (Signed)
Medication refill request: Xanax 1 mg  Last AEX:  11/17/13 with TL  Next AEX: No AEX scheduled for 2016 Last MMG (if hormonal medication request): N/A Refill authorized: #30/1 rfs, please advise.

## 2014-08-09 NOTE — Telephone Encounter (Signed)
Rx faxed to [facility]

## 2014-08-15 ENCOUNTER — Other Ambulatory Visit: Payer: Self-pay | Admitting: Obstetrics & Gynecology

## 2014-08-15 NOTE — Telephone Encounter (Signed)
Expand All Collapse All   Medication refill request: Xanax 1 mg  Last AEX: 11/17/13 with TL  Next AEX: No AEX scheduled for 2016 Last MMG (if hormonal medication request): N/A Refill authorized: #30/1         Rx faxed to pharmacy

## 2014-09-28 ENCOUNTER — Other Ambulatory Visit: Payer: Self-pay

## 2014-09-28 DIAGNOSIS — Z1231 Encounter for screening mammogram for malignant neoplasm of breast: Secondary | ICD-10-CM

## 2014-10-15 ENCOUNTER — Other Ambulatory Visit: Payer: Self-pay | Admitting: Obstetrics & Gynecology

## 2014-10-15 NOTE — Telephone Encounter (Signed)
Medication refill request: Xanax  Last AEX: 11/17/13 with SM  Next AEX: 12/14/14 with SM Last MMG (if hormonal medication request): N/A Refill authorized Please advise.

## 2014-10-15 NOTE — Telephone Encounter (Signed)
RX faxed to pharmacy.

## 2014-10-15 NOTE — Telephone Encounter (Signed)
Last AEX was with Dr. Charlies Constable, [facility].

## 2014-10-19 ENCOUNTER — Ambulatory Visit
Admission: RE | Admit: 2014-10-19 | Discharge: 2014-10-19 | Disposition: A | Discharge status: Home / Self Care | Payer: No Typology Code available for payment source | Source: Ambulatory Visit

## 2014-10-19 ENCOUNTER — Encounter (INDEPENDENT_AMBULATORY_CARE_PROVIDER_SITE_OTHER): Payer: Self-pay

## 2014-10-19 DIAGNOSIS — Z1231 Encounter for screening mammogram for malignant neoplasm of breast: Secondary | ICD-10-CM

## 2014-10-26 ENCOUNTER — Other Ambulatory Visit: Payer: Self-pay | Admitting: Internal Medicine

## 2014-10-26 DIAGNOSIS — N6459 Other signs and symptoms in breast: Secondary | ICD-10-CM

## 2014-11-16 ENCOUNTER — Other Ambulatory Visit: Payer: Self-pay | Admitting: Obstetrics & Gynecology

## 2014-11-16 NOTE — Telephone Encounter (Signed)
Medication refill request: Xanax  Last Filled:  10/15/14, #30 x 0 Last AEX: 11/17/13 with TL Next AEX: 12/14/14 with SM Last MMG (if hormonal medication request): N/A Refill authorized:  Please advise.

## 2014-11-19 ENCOUNTER — Ambulatory Visit: Payer: 59 | Admitting: Gynecology

## 2014-11-19 NOTE — Telephone Encounter (Signed)
Faxed to [facility] by [facility] on 11/16/14.

## 2014-11-21 ENCOUNTER — Ambulatory Visit: Payer: 59 | Admitting: Gynecology

## 2014-12-11 ENCOUNTER — Other Ambulatory Visit: Payer: No Typology Code available for payment source

## 2014-12-13 ENCOUNTER — Other Ambulatory Visit: Payer: Self-pay | Admitting: Obstetrics & Gynecology

## 2014-12-13 NOTE — Telephone Encounter (Signed)
I am declining this rx.  Pt has appt tomorrow but often no shows or cancels.

## 2014-12-13 NOTE — Telephone Encounter (Signed)
Medication refill request: Restoril 15 mg Last AEX:  11/17/13 with TL Next AEX: 12/14/14 with SM Last MMG (if hormonal medication request): n/a Refill authorized: #30/0 rfs

## 2014-12-14 ENCOUNTER — Ambulatory Visit: Payer: 59 | Admitting: Obstetrics & Gynecology

## 2014-12-14 ENCOUNTER — Telehealth: Payer: Self-pay | Admitting: Obstetrics & Gynecology

## 2014-12-14 NOTE — Telephone Encounter (Addendum)
Patient called earlier this morning stating her power is out at her home and she would be late to her 9:45 am appointment. I told her we would be glad to see her if she arrived by 10:00 am. Patient arrived at 10:06 am and I told her she would need to reschedule. Patient was willing to see another provider today. No appointments available today. Patient did not wish to reschedule and asked for her records. Patient said "I have been canceled on twice for this appointment, I would like to find a new Doctor. I gave patient a medical release to complete. I told patient I was very sorry.

## 2014-12-14 NOTE — Telephone Encounter (Signed)
Rx denied.  Pt has appt today.

## 2014-12-14 NOTE — Telephone Encounter (Signed)
Can you review your note for corrections?  Thanks.

## 2014-12-14 NOTE — Telephone Encounter (Signed)
I'm sorry, I misread the note.  Ok to close encounter.

## 2014-12-14 NOTE — Telephone Encounter (Signed)
Medication refill request: Xanax 1 Last AEX:  11/17/13 Next AEX: 12/14/14 Last MMG (if hormonal medication request):  Refill authorized: Please advise.

## 2015-02-15 ENCOUNTER — Ambulatory Visit
Admission: RE | Admit: 2015-02-15 | Discharge: 2015-02-15 | Disposition: A | Discharge status: Home / Self Care | Payer: No Typology Code available for payment source | Source: Ambulatory Visit | Attending: Internal Medicine | Admitting: Internal Medicine

## 2015-02-15 DIAGNOSIS — N6459 Other signs and symptoms in breast: Secondary | ICD-10-CM

## 2016-09-15 ENCOUNTER — Other Ambulatory Visit: Payer: Self-pay | Admitting: Internal Medicine

## 2016-09-15 DIAGNOSIS — Z1231 Encounter for screening mammogram for malignant neoplasm of breast: Secondary | ICD-10-CM

## 2016-10-06 ENCOUNTER — Ambulatory Visit
Admission: RE | Admit: 2016-10-06 | Discharge: 2016-10-06 | Disposition: A | Discharge status: Home / Self Care | Payer: 59 | Source: Ambulatory Visit | Attending: Internal Medicine | Admitting: Internal Medicine

## 2016-10-06 DIAGNOSIS — Z1231 Encounter for screening mammogram for malignant neoplasm of breast: Secondary | ICD-10-CM

## 2017-03-10 ENCOUNTER — Encounter (INDEPENDENT_AMBULATORY_CARE_PROVIDER_SITE_OTHER): Payer: Self-pay | Admitting: *Deleted

## 2017-03-16 ENCOUNTER — Other Ambulatory Visit (HOSPITAL_COMMUNITY): Payer: Self-pay | Admitting: Internal Medicine

## 2017-03-16 DIAGNOSIS — Z0000 Encounter for general adult medical examination without abnormal findings: Secondary | ICD-10-CM

## 2017-03-24 ENCOUNTER — Other Ambulatory Visit (HOSPITAL_COMMUNITY): Payer: Self-pay | Admitting: Internal Medicine

## 2017-03-24 DIAGNOSIS — Z780 Asymptomatic menopausal state: Secondary | ICD-10-CM

## 2017-03-25 ENCOUNTER — Other Ambulatory Visit (HOSPITAL_COMMUNITY): Payer: 59

## 2017-03-25 ENCOUNTER — Encounter (HOSPITAL_COMMUNITY): Payer: Self-pay

## 2017-06-15 DIAGNOSIS — J3489 Other specified disorders of nose and nasal sinuses: Secondary | ICD-10-CM | POA: Diagnosis not present

## 2017-06-15 DIAGNOSIS — Z808 Family history of malignant neoplasm of other organs or systems: Secondary | ICD-10-CM | POA: Diagnosis not present

## 2017-06-15 DIAGNOSIS — L570 Actinic keratosis: Secondary | ICD-10-CM | POA: Diagnosis not present

## 2017-06-15 DIAGNOSIS — D485 Neoplasm of uncertain behavior of skin: Secondary | ICD-10-CM | POA: Diagnosis not present

## 2017-06-15 DIAGNOSIS — L309 Dermatitis, unspecified: Secondary | ICD-10-CM | POA: Diagnosis not present

## 2017-06-23 DIAGNOSIS — L821 Other seborrheic keratosis: Secondary | ICD-10-CM | POA: Diagnosis not present

## 2017-06-23 DIAGNOSIS — K130 Diseases of lips: Secondary | ICD-10-CM | POA: Diagnosis not present

## 2017-06-23 DIAGNOSIS — L814 Other melanin hyperpigmentation: Secondary | ICD-10-CM | POA: Diagnosis not present

## 2017-06-23 DIAGNOSIS — I872 Venous insufficiency (chronic) (peripheral): Secondary | ICD-10-CM | POA: Diagnosis not present

## 2017-06-23 DIAGNOSIS — L570 Actinic keratosis: Secondary | ICD-10-CM | POA: Diagnosis not present

## 2017-06-23 DIAGNOSIS — L309 Dermatitis, unspecified: Secondary | ICD-10-CM | POA: Diagnosis not present

## 2017-06-23 DIAGNOSIS — D485 Neoplasm of uncertain behavior of skin: Secondary | ICD-10-CM | POA: Diagnosis not present

## 2017-06-23 DIAGNOSIS — Z85820 Personal history of malignant melanoma of skin: Secondary | ICD-10-CM | POA: Diagnosis not present

## 2017-06-25 DIAGNOSIS — C4359 Malignant melanoma of other part of trunk: Secondary | ICD-10-CM | POA: Diagnosis not present

## 2017-07-06 DIAGNOSIS — C439 Malignant melanoma of skin, unspecified: Secondary | ICD-10-CM | POA: Diagnosis not present

## 2017-07-06 DIAGNOSIS — L989 Disorder of the skin and subcutaneous tissue, unspecified: Secondary | ICD-10-CM | POA: Diagnosis not present

## 2017-07-06 DIAGNOSIS — Z880 Allergy status to penicillin: Secondary | ICD-10-CM | POA: Diagnosis not present

## 2017-07-06 DIAGNOSIS — Z885 Allergy status to narcotic agent status: Secondary | ICD-10-CM | POA: Diagnosis not present

## 2017-07-20 DIAGNOSIS — L309 Dermatitis, unspecified: Secondary | ICD-10-CM | POA: Diagnosis not present

## 2017-07-20 DIAGNOSIS — D0471 Carcinoma in situ of skin of right lower limb, including hip: Secondary | ICD-10-CM | POA: Diagnosis not present

## 2017-07-20 DIAGNOSIS — L570 Actinic keratosis: Secondary | ICD-10-CM | POA: Diagnosis not present

## 2017-07-20 DIAGNOSIS — D485 Neoplasm of uncertain behavior of skin: Secondary | ICD-10-CM | POA: Diagnosis not present

## 2017-07-20 DIAGNOSIS — D0461 Carcinoma in situ of skin of right upper limb, including shoulder: Secondary | ICD-10-CM | POA: Diagnosis not present

## 2017-07-21 DIAGNOSIS — Z91048 Other nonmedicinal substance allergy status: Secondary | ICD-10-CM | POA: Diagnosis not present

## 2017-07-21 DIAGNOSIS — Z885 Allergy status to narcotic agent status: Secondary | ICD-10-CM | POA: Diagnosis not present

## 2017-07-21 DIAGNOSIS — Z79899 Other long term (current) drug therapy: Secondary | ICD-10-CM | POA: Diagnosis not present

## 2017-07-21 DIAGNOSIS — C4371 Malignant melanoma of right lower limb, including hip: Secondary | ICD-10-CM | POA: Diagnosis not present

## 2017-07-21 DIAGNOSIS — J45909 Unspecified asthma, uncomplicated: Secondary | ICD-10-CM | POA: Diagnosis not present

## 2017-07-21 DIAGNOSIS — Z791 Long term (current) use of non-steroidal anti-inflammatories (NSAID): Secondary | ICD-10-CM | POA: Diagnosis not present

## 2017-07-21 DIAGNOSIS — Z880 Allergy status to penicillin: Secondary | ICD-10-CM | POA: Diagnosis not present

## 2017-07-21 DIAGNOSIS — I10 Essential (primary) hypertension: Secondary | ICD-10-CM | POA: Diagnosis not present

## 2017-07-21 DIAGNOSIS — C4359 Malignant melanoma of other part of trunk: Secondary | ICD-10-CM | POA: Diagnosis not present

## 2017-07-21 DIAGNOSIS — K219 Gastro-esophageal reflux disease without esophagitis: Secondary | ICD-10-CM | POA: Diagnosis not present

## 2017-07-21 DIAGNOSIS — R590 Localized enlarged lymph nodes: Secondary | ICD-10-CM | POA: Diagnosis not present

## 2017-07-21 DIAGNOSIS — Z981 Arthrodesis status: Secondary | ICD-10-CM | POA: Diagnosis not present

## 2017-07-22 DIAGNOSIS — C4359 Malignant melanoma of other part of trunk: Secondary | ICD-10-CM | POA: Diagnosis not present

## 2017-07-22 DIAGNOSIS — Z01810 Encounter for preprocedural cardiovascular examination: Secondary | ICD-10-CM | POA: Diagnosis not present

## 2017-08-09 DIAGNOSIS — J45909 Unspecified asthma, uncomplicated: Secondary | ICD-10-CM | POA: Diagnosis not present

## 2017-08-09 DIAGNOSIS — D0471 Carcinoma in situ of skin of right lower limb, including hip: Secondary | ICD-10-CM | POA: Diagnosis not present

## 2017-08-09 DIAGNOSIS — C439 Malignant melanoma of skin, unspecified: Secondary | ICD-10-CM | POA: Diagnosis not present

## 2017-08-09 DIAGNOSIS — C4359 Malignant melanoma of other part of trunk: Secondary | ICD-10-CM | POA: Diagnosis not present

## 2017-08-09 DIAGNOSIS — Z79899 Other long term (current) drug therapy: Secondary | ICD-10-CM | POA: Diagnosis not present

## 2017-08-09 DIAGNOSIS — C4371 Malignant melanoma of right lower limb, including hip: Secondary | ICD-10-CM | POA: Diagnosis not present

## 2017-08-09 DIAGNOSIS — I10 Essential (primary) hypertension: Secondary | ICD-10-CM | POA: Diagnosis not present

## 2017-08-09 DIAGNOSIS — L905 Scar conditions and fibrosis of skin: Secondary | ICD-10-CM | POA: Diagnosis not present

## 2017-08-09 DIAGNOSIS — R590 Localized enlarged lymph nodes: Secondary | ICD-10-CM | POA: Diagnosis not present

## 2017-08-09 DIAGNOSIS — K219 Gastro-esophageal reflux disease without esophagitis: Secondary | ICD-10-CM | POA: Diagnosis not present

## 2017-08-24 DIAGNOSIS — T451X5A Adverse effect of antineoplastic and immunosuppressive drugs, initial encounter: Secondary | ICD-10-CM | POA: Diagnosis not present

## 2017-08-24 DIAGNOSIS — Z85820 Personal history of malignant melanoma of skin: Secondary | ICD-10-CM | POA: Diagnosis not present

## 2017-08-24 DIAGNOSIS — L570 Actinic keratosis: Secondary | ICD-10-CM | POA: Diagnosis not present

## 2017-09-03 DIAGNOSIS — Z880 Allergy status to penicillin: Secondary | ICD-10-CM | POA: Diagnosis not present

## 2017-09-03 DIAGNOSIS — Z885 Allergy status to narcotic agent status: Secondary | ICD-10-CM | POA: Diagnosis not present

## 2017-09-03 DIAGNOSIS — C4359 Malignant melanoma of other part of trunk: Secondary | ICD-10-CM | POA: Diagnosis not present

## 2017-09-14 DIAGNOSIS — E782 Mixed hyperlipidemia: Secondary | ICD-10-CM | POA: Diagnosis not present

## 2017-09-14 DIAGNOSIS — I10 Essential (primary) hypertension: Secondary | ICD-10-CM | POA: Diagnosis not present

## 2017-09-16 DIAGNOSIS — D485 Neoplasm of uncertain behavior of skin: Secondary | ICD-10-CM | POA: Diagnosis not present

## 2017-09-16 DIAGNOSIS — D487 Neoplasm of uncertain behavior of other specified sites: Secondary | ICD-10-CM | POA: Diagnosis not present

## 2017-09-16 DIAGNOSIS — L814 Other melanin hyperpigmentation: Secondary | ICD-10-CM | POA: Diagnosis not present

## 2017-09-16 DIAGNOSIS — L570 Actinic keratosis: Secondary | ICD-10-CM | POA: Diagnosis not present

## 2017-09-16 DIAGNOSIS — K219 Gastro-esophageal reflux disease without esophagitis: Secondary | ICD-10-CM | POA: Diagnosis not present

## 2017-09-16 DIAGNOSIS — E782 Mixed hyperlipidemia: Secondary | ICD-10-CM | POA: Diagnosis not present

## 2017-09-16 DIAGNOSIS — L430 Hypertrophic lichen planus: Secondary | ICD-10-CM | POA: Diagnosis not present

## 2017-09-16 DIAGNOSIS — I10 Essential (primary) hypertension: Secondary | ICD-10-CM | POA: Diagnosis not present

## 2017-09-16 DIAGNOSIS — C4359 Malignant melanoma of other part of trunk: Secondary | ICD-10-CM | POA: Diagnosis not present

## 2017-09-16 DIAGNOSIS — G252 Other specified forms of tremor: Secondary | ICD-10-CM | POA: Diagnosis not present

## 2017-09-16 DIAGNOSIS — L821 Other seborrheic keratosis: Secondary | ICD-10-CM | POA: Diagnosis not present

## 2017-09-16 DIAGNOSIS — Z85820 Personal history of malignant melanoma of skin: Secondary | ICD-10-CM | POA: Diagnosis not present

## 2017-09-16 DIAGNOSIS — Z85828 Personal history of other malignant neoplasm of skin: Secondary | ICD-10-CM | POA: Diagnosis not present

## 2017-09-23 ENCOUNTER — Ambulatory Visit (HOSPITAL_COMMUNITY)
Admission: RE | Admit: 2017-09-23 | Discharge: 2017-09-23 | Disposition: A | Discharge status: Home / Self Care | Payer: Medicare Other | Source: Ambulatory Visit | Attending: Internal Medicine | Admitting: Internal Medicine

## 2017-09-23 DIAGNOSIS — Z13820 Encounter for screening for osteoporosis: Secondary | ICD-10-CM | POA: Diagnosis not present

## 2017-09-23 DIAGNOSIS — Z780 Asymptomatic menopausal state: Secondary | ICD-10-CM | POA: Insufficient documentation

## 2017-09-23 DIAGNOSIS — M85832 Other specified disorders of bone density and structure, left forearm: Secondary | ICD-10-CM | POA: Diagnosis not present

## 2017-10-28 DIAGNOSIS — C44729 Squamous cell carcinoma of skin of left lower limb, including hip: Secondary | ICD-10-CM | POA: Diagnosis not present

## 2017-10-28 DIAGNOSIS — Z85820 Personal history of malignant melanoma of skin: Secondary | ICD-10-CM | POA: Diagnosis not present

## 2017-10-28 DIAGNOSIS — Z85828 Personal history of other malignant neoplasm of skin: Secondary | ICD-10-CM | POA: Diagnosis not present

## 2017-10-28 DIAGNOSIS — D485 Neoplasm of uncertain behavior of skin: Secondary | ICD-10-CM | POA: Diagnosis not present

## 2017-10-28 DIAGNOSIS — L821 Other seborrheic keratosis: Secondary | ICD-10-CM | POA: Diagnosis not present

## 2017-10-28 DIAGNOSIS — L439 Lichen planus, unspecified: Secondary | ICD-10-CM | POA: Diagnosis not present

## 2017-11-09 DIAGNOSIS — C4359 Malignant melanoma of other part of trunk: Secondary | ICD-10-CM | POA: Diagnosis not present

## 2017-11-09 DIAGNOSIS — C44729 Squamous cell carcinoma of skin of left lower limb, including hip: Secondary | ICD-10-CM | POA: Diagnosis not present

## 2017-11-09 DIAGNOSIS — Z483 Aftercare following surgery for neoplasm: Secondary | ICD-10-CM | POA: Diagnosis not present

## 2017-11-29 DIAGNOSIS — D2272 Melanocytic nevi of left lower limb, including hip: Secondary | ICD-10-CM | POA: Diagnosis not present

## 2017-11-29 DIAGNOSIS — C44729 Squamous cell carcinoma of skin of left lower limb, including hip: Secondary | ICD-10-CM | POA: Diagnosis not present

## 2017-11-29 DIAGNOSIS — C4492 Squamous cell carcinoma of skin, unspecified: Secondary | ICD-10-CM | POA: Diagnosis not present

## 2017-12-06 ENCOUNTER — Other Ambulatory Visit: Payer: Self-pay | Admitting: Internal Medicine

## 2017-12-06 DIAGNOSIS — Z1231 Encounter for screening mammogram for malignant neoplasm of breast: Secondary | ICD-10-CM

## 2017-12-07 ENCOUNTER — Ambulatory Visit
Admission: RE | Admit: 2017-12-07 | Discharge: 2017-12-07 | Disposition: A | Discharge status: Home / Self Care | Payer: Medicare Other | Source: Ambulatory Visit | Attending: Internal Medicine | Admitting: Internal Medicine

## 2017-12-07 DIAGNOSIS — Z1231 Encounter for screening mammogram for malignant neoplasm of breast: Secondary | ICD-10-CM

## 2017-12-24 DIAGNOSIS — Z48817 Encounter for surgical aftercare following surgery on the skin and subcutaneous tissue: Secondary | ICD-10-CM | POA: Diagnosis not present

## 2017-12-24 DIAGNOSIS — M79604 Pain in right leg: Secondary | ICD-10-CM | POA: Diagnosis not present

## 2017-12-24 DIAGNOSIS — G8929 Other chronic pain: Secondary | ICD-10-CM | POA: Diagnosis not present

## 2017-12-28 DIAGNOSIS — Z85828 Personal history of other malignant neoplasm of skin: Secondary | ICD-10-CM | POA: Diagnosis not present

## 2017-12-28 DIAGNOSIS — L570 Actinic keratosis: Secondary | ICD-10-CM | POA: Diagnosis not present

## 2017-12-28 DIAGNOSIS — L439 Lichen planus, unspecified: Secondary | ICD-10-CM | POA: Diagnosis not present

## 2017-12-28 DIAGNOSIS — L821 Other seborrheic keratosis: Secondary | ICD-10-CM | POA: Diagnosis not present

## 2017-12-28 DIAGNOSIS — D485 Neoplasm of uncertain behavior of skin: Secondary | ICD-10-CM | POA: Diagnosis not present

## 2017-12-28 DIAGNOSIS — Z85820 Personal history of malignant melanoma of skin: Secondary | ICD-10-CM | POA: Diagnosis not present

## 2017-12-28 DIAGNOSIS — L309 Dermatitis, unspecified: Secondary | ICD-10-CM | POA: Diagnosis not present

## 2017-12-28 DIAGNOSIS — L308 Other specified dermatitis: Secondary | ICD-10-CM | POA: Diagnosis not present

## 2017-12-31 DIAGNOSIS — M7061 Trochanteric bursitis, right hip: Secondary | ICD-10-CM | POA: Diagnosis not present

## 2017-12-31 DIAGNOSIS — M170 Bilateral primary osteoarthritis of knee: Secondary | ICD-10-CM | POA: Diagnosis not present

## 2018-01-17 DIAGNOSIS — Z85828 Personal history of other malignant neoplasm of skin: Secondary | ICD-10-CM | POA: Diagnosis not present

## 2018-01-17 DIAGNOSIS — L570 Actinic keratosis: Secondary | ICD-10-CM | POA: Diagnosis not present

## 2018-01-17 DIAGNOSIS — L309 Dermatitis, unspecified: Secondary | ICD-10-CM | POA: Diagnosis not present

## 2018-03-10 DIAGNOSIS — Z85820 Personal history of malignant melanoma of skin: Secondary | ICD-10-CM | POA: Diagnosis not present

## 2018-03-10 DIAGNOSIS — L821 Other seborrheic keratosis: Secondary | ICD-10-CM | POA: Diagnosis not present

## 2018-03-10 DIAGNOSIS — L814 Other melanin hyperpigmentation: Secondary | ICD-10-CM | POA: Diagnosis not present

## 2018-03-10 DIAGNOSIS — I781 Nevus, non-neoplastic: Secondary | ICD-10-CM | POA: Diagnosis not present

## 2018-03-10 DIAGNOSIS — Z85828 Personal history of other malignant neoplasm of skin: Secondary | ICD-10-CM | POA: Diagnosis not present

## 2018-03-10 DIAGNOSIS — L820 Inflamed seborrheic keratosis: Secondary | ICD-10-CM | POA: Diagnosis not present

## 2018-03-11 DIAGNOSIS — Z85820 Personal history of malignant melanoma of skin: Secondary | ICD-10-CM | POA: Diagnosis not present

## 2018-03-11 DIAGNOSIS — Z85828 Personal history of other malignant neoplasm of skin: Secondary | ICD-10-CM | POA: Diagnosis not present

## 2018-03-11 DIAGNOSIS — Z08 Encounter for follow-up examination after completed treatment for malignant neoplasm: Secondary | ICD-10-CM | POA: Diagnosis not present

## 2018-03-16 DIAGNOSIS — I10 Essential (primary) hypertension: Secondary | ICD-10-CM | POA: Diagnosis not present

## 2018-03-16 DIAGNOSIS — R945 Abnormal results of liver function studies: Secondary | ICD-10-CM | POA: Diagnosis not present

## 2018-03-16 DIAGNOSIS — E782 Mixed hyperlipidemia: Secondary | ICD-10-CM | POA: Diagnosis not present

## 2018-03-17 DIAGNOSIS — T819XXD Unspecified complication of procedure, subsequent encounter: Secondary | ICD-10-CM | POA: Diagnosis not present

## 2018-03-17 DIAGNOSIS — Z85828 Personal history of other malignant neoplasm of skin: Secondary | ICD-10-CM | POA: Diagnosis not present

## 2018-03-17 DIAGNOSIS — S81801D Unspecified open wound, right lower leg, subsequent encounter: Secondary | ICD-10-CM | POA: Diagnosis not present

## 2018-03-18 DIAGNOSIS — R251 Tremor, unspecified: Secondary | ICD-10-CM | POA: Diagnosis not present

## 2018-03-18 DIAGNOSIS — R945 Abnormal results of liver function studies: Secondary | ICD-10-CM | POA: Diagnosis not present

## 2018-03-18 DIAGNOSIS — K219 Gastro-esophageal reflux disease without esophagitis: Secondary | ICD-10-CM | POA: Diagnosis not present

## 2018-03-18 DIAGNOSIS — Z0000 Encounter for general adult medical examination without abnormal findings: Secondary | ICD-10-CM | POA: Diagnosis not present

## 2018-03-18 DIAGNOSIS — E782 Mixed hyperlipidemia: Secondary | ICD-10-CM | POA: Diagnosis not present

## 2018-03-18 DIAGNOSIS — I10 Essential (primary) hypertension: Secondary | ICD-10-CM | POA: Diagnosis not present

## 2018-03-18 DIAGNOSIS — C439 Malignant melanoma of skin, unspecified: Secondary | ICD-10-CM | POA: Diagnosis not present

## 2018-03-31 DIAGNOSIS — Z85828 Personal history of other malignant neoplasm of skin: Secondary | ICD-10-CM | POA: Diagnosis not present

## 2018-03-31 DIAGNOSIS — Z85820 Personal history of malignant melanoma of skin: Secondary | ICD-10-CM | POA: Diagnosis not present

## 2018-03-31 DIAGNOSIS — S81801D Unspecified open wound, right lower leg, subsequent encounter: Secondary | ICD-10-CM | POA: Diagnosis not present

## 2018-04-14 ENCOUNTER — Ambulatory Visit: Payer: Medicare Other

## 2018-07-13 DIAGNOSIS — L570 Actinic keratosis: Secondary | ICD-10-CM | POA: Diagnosis not present

## 2018-07-13 DIAGNOSIS — L814 Other melanin hyperpigmentation: Secondary | ICD-10-CM | POA: Diagnosis not present

## 2018-07-13 DIAGNOSIS — I781 Nevus, non-neoplastic: Secondary | ICD-10-CM | POA: Diagnosis not present

## 2018-07-13 DIAGNOSIS — Z85820 Personal history of malignant melanoma of skin: Secondary | ICD-10-CM | POA: Diagnosis not present

## 2018-07-13 DIAGNOSIS — D485 Neoplasm of uncertain behavior of skin: Secondary | ICD-10-CM | POA: Diagnosis not present

## 2018-07-13 DIAGNOSIS — Z808 Family history of malignant neoplasm of other organs or systems: Secondary | ICD-10-CM | POA: Diagnosis not present

## 2018-07-13 DIAGNOSIS — Z85828 Personal history of other malignant neoplasm of skin: Secondary | ICD-10-CM | POA: Diagnosis not present

## 2018-07-13 DIAGNOSIS — L821 Other seborrheic keratosis: Secondary | ICD-10-CM | POA: Diagnosis not present

## 2018-07-13 DIAGNOSIS — L820 Inflamed seborrheic keratosis: Secondary | ICD-10-CM | POA: Diagnosis not present

## 2018-09-22 DIAGNOSIS — E782 Mixed hyperlipidemia: Secondary | ICD-10-CM | POA: Diagnosis not present

## 2018-09-22 DIAGNOSIS — I10 Essential (primary) hypertension: Secondary | ICD-10-CM | POA: Diagnosis not present

## 2018-09-22 DIAGNOSIS — R945 Abnormal results of liver function studies: Secondary | ICD-10-CM | POA: Diagnosis not present

## 2018-09-28 DIAGNOSIS — R251 Tremor, unspecified: Secondary | ICD-10-CM | POA: Diagnosis not present

## 2018-09-28 DIAGNOSIS — L03115 Cellulitis of right lower limb: Secondary | ICD-10-CM | POA: Diagnosis not present

## 2018-09-28 DIAGNOSIS — I10 Essential (primary) hypertension: Secondary | ICD-10-CM | POA: Diagnosis not present

## 2018-09-28 DIAGNOSIS — E785 Hyperlipidemia, unspecified: Secondary | ICD-10-CM | POA: Diagnosis not present

## 2018-09-28 DIAGNOSIS — K219 Gastro-esophageal reflux disease without esophagitis: Secondary | ICD-10-CM | POA: Diagnosis not present

## 2018-10-13 ENCOUNTER — Other Ambulatory Visit: Payer: Self-pay | Admitting: Internal Medicine

## 2018-10-13 DIAGNOSIS — M79671 Pain in right foot: Secondary | ICD-10-CM

## 2018-10-14 ENCOUNTER — Other Ambulatory Visit (HOSPITAL_COMMUNITY): Payer: Self-pay | Admitting: Internal Medicine

## 2018-10-14 ENCOUNTER — Other Ambulatory Visit: Payer: Self-pay | Admitting: Internal Medicine

## 2018-10-14 ENCOUNTER — Other Ambulatory Visit: Payer: Self-pay

## 2018-10-14 ENCOUNTER — Ambulatory Visit (HOSPITAL_COMMUNITY)
Admission: RE | Admit: 2018-10-14 | Discharge: 2018-10-14 | Disposition: A | Discharge status: Home / Self Care | Payer: Medicare Other | Source: Ambulatory Visit | Attending: Internal Medicine | Admitting: Internal Medicine

## 2018-10-14 ENCOUNTER — Other Ambulatory Visit: Payer: Self-pay | Admitting: Adult Health Nurse Practitioner

## 2018-10-14 ENCOUNTER — Other Ambulatory Visit (HOSPITAL_COMMUNITY): Payer: Self-pay | Admitting: Adult Health Nurse Practitioner

## 2018-10-14 DIAGNOSIS — M7989 Other specified soft tissue disorders: Secondary | ICD-10-CM | POA: Diagnosis not present

## 2018-10-14 DIAGNOSIS — M79671 Pain in right foot: Secondary | ICD-10-CM | POA: Insufficient documentation

## 2018-10-14 DIAGNOSIS — R2241 Localized swelling, mass and lump, right lower limb: Secondary | ICD-10-CM | POA: Diagnosis not present

## 2018-10-14 DIAGNOSIS — M25571 Pain in right ankle and joints of right foot: Secondary | ICD-10-CM | POA: Diagnosis not present

## 2018-10-14 DIAGNOSIS — M79661 Pain in right lower leg: Secondary | ICD-10-CM | POA: Diagnosis not present

## 2018-10-15 DIAGNOSIS — L03115 Cellulitis of right lower limb: Secondary | ICD-10-CM | POA: Diagnosis not present

## 2018-10-19 DIAGNOSIS — L570 Actinic keratosis: Secondary | ICD-10-CM | POA: Diagnosis not present

## 2018-10-19 DIAGNOSIS — L821 Other seborrheic keratosis: Secondary | ICD-10-CM | POA: Diagnosis not present

## 2018-10-19 DIAGNOSIS — Z85820 Personal history of malignant melanoma of skin: Secondary | ICD-10-CM | POA: Diagnosis not present

## 2018-10-19 DIAGNOSIS — I781 Nevus, non-neoplastic: Secondary | ICD-10-CM | POA: Diagnosis not present

## 2018-10-19 DIAGNOSIS — Z85828 Personal history of other malignant neoplasm of skin: Secondary | ICD-10-CM | POA: Diagnosis not present

## 2019-03-08 DIAGNOSIS — L570 Actinic keratosis: Secondary | ICD-10-CM | POA: Diagnosis not present

## 2019-03-08 DIAGNOSIS — L820 Inflamed seborrheic keratosis: Secondary | ICD-10-CM | POA: Diagnosis not present

## 2019-03-08 DIAGNOSIS — Z85820 Personal history of malignant melanoma of skin: Secondary | ICD-10-CM | POA: Diagnosis not present

## 2019-03-08 DIAGNOSIS — Z85828 Personal history of other malignant neoplasm of skin: Secondary | ICD-10-CM | POA: Diagnosis not present

## 2019-03-08 DIAGNOSIS — L309 Dermatitis, unspecified: Secondary | ICD-10-CM | POA: Diagnosis not present

## 2019-03-08 DIAGNOSIS — L821 Other seborrheic keratosis: Secondary | ICD-10-CM | POA: Diagnosis not present

## 2019-03-08 DIAGNOSIS — D485 Neoplasm of uncertain behavior of skin: Secondary | ICD-10-CM | POA: Diagnosis not present

## 2019-04-17 DIAGNOSIS — Z808 Family history of malignant neoplasm of other organs or systems: Secondary | ICD-10-CM | POA: Diagnosis not present

## 2019-04-17 DIAGNOSIS — Z85828 Personal history of other malignant neoplasm of skin: Secondary | ICD-10-CM | POA: Diagnosis not present

## 2019-04-17 DIAGNOSIS — L309 Dermatitis, unspecified: Secondary | ICD-10-CM | POA: Diagnosis not present

## 2019-04-17 DIAGNOSIS — L570 Actinic keratosis: Secondary | ICD-10-CM | POA: Diagnosis not present

## 2019-07-06 IMAGING — US RIGHT LOWER EXTREMITY VENOUS ULTRASOUND
1 series · 13 of 24 positions shown · non-contrast
Comparison: None.

CLINICAL DATA: 68-year-old female with right lower extremity foot
pain and swelling



[Series 1: right lower extremity venous ultrasound · 0.08mm/px · 13 of 58 slices shown]
[im 1/58]
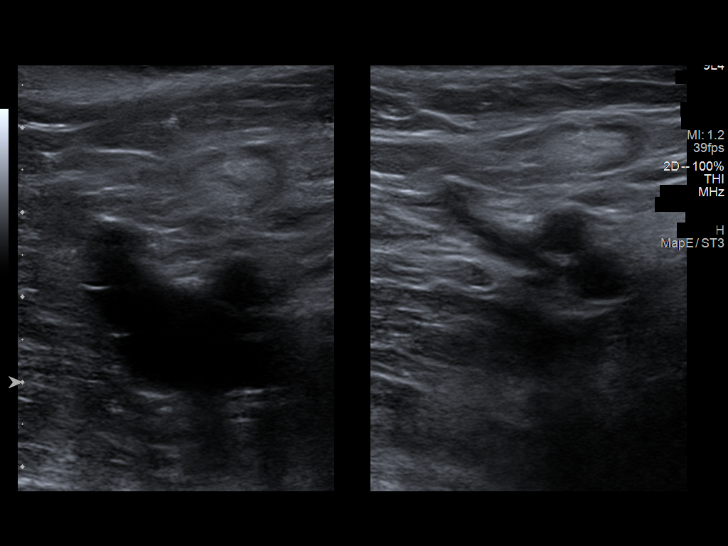
[im 5/58]
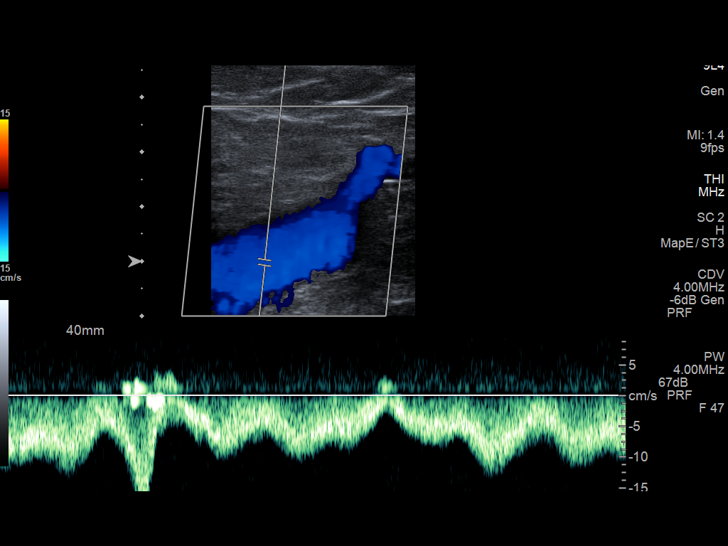
[im 10/58]
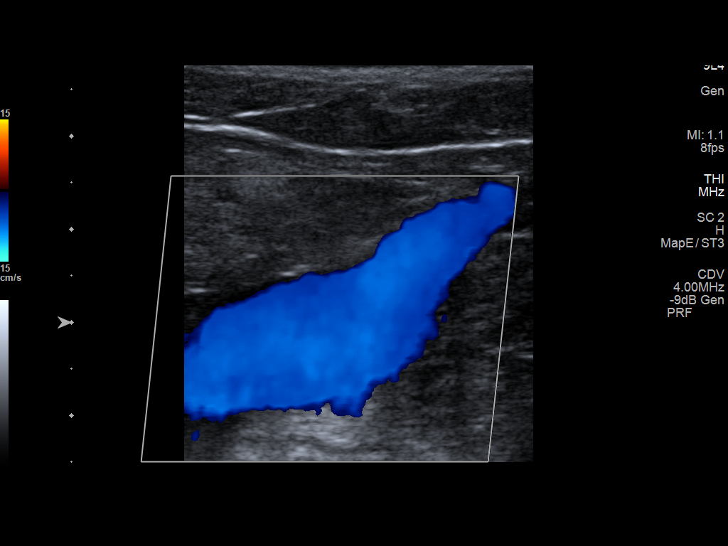
[im 15/58]
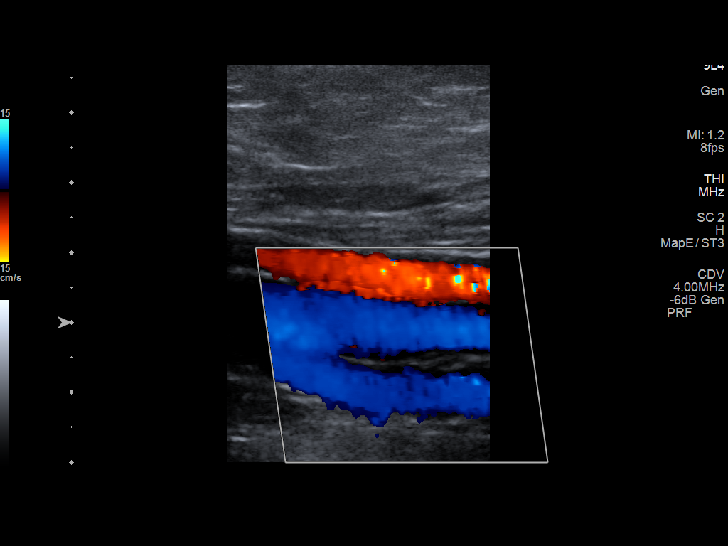
[im 20/58]
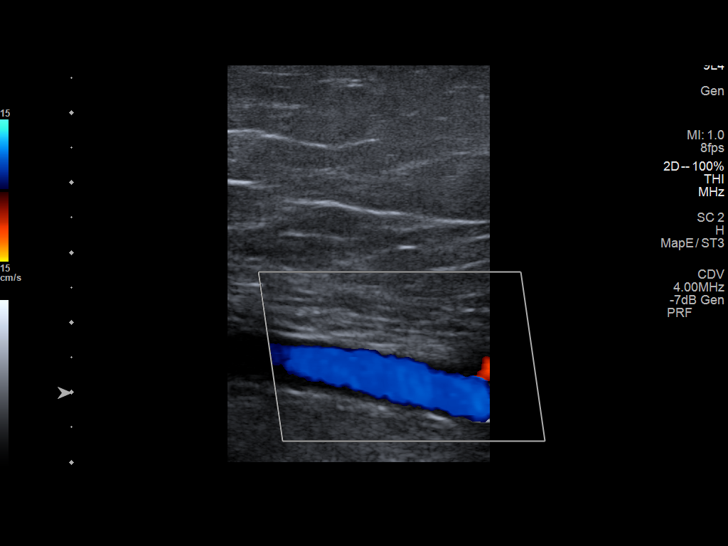
[im 25/58]
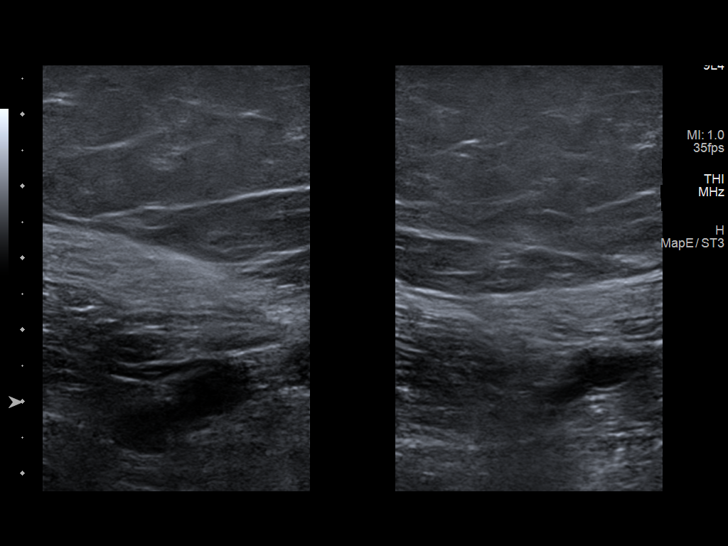
[im 30/58]
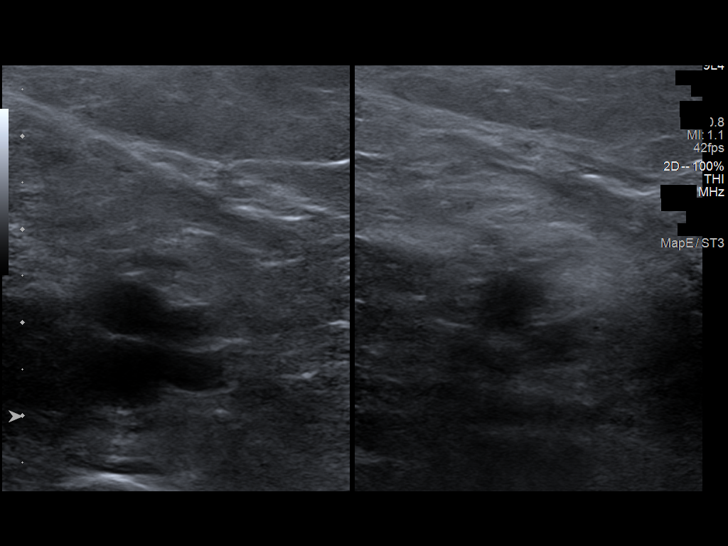
[im 33/58]
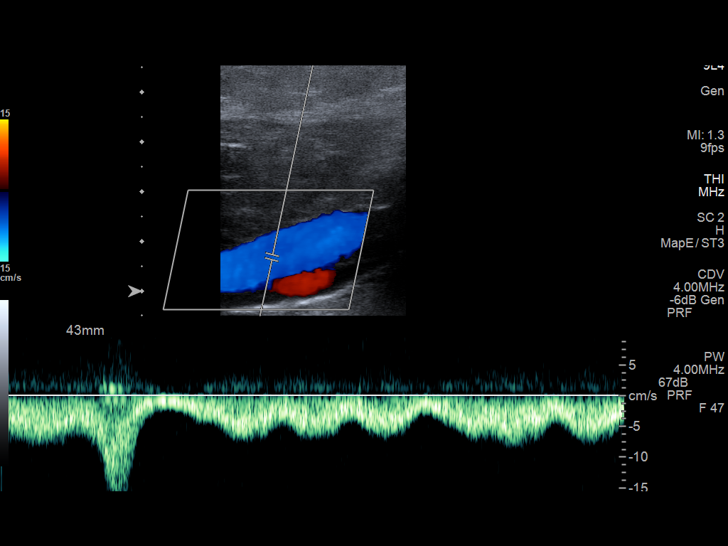
[im 38/58]
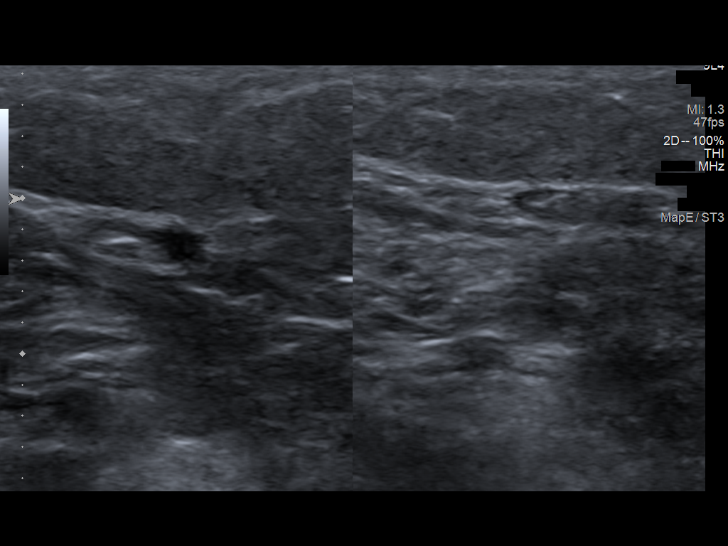
[im 43/58]
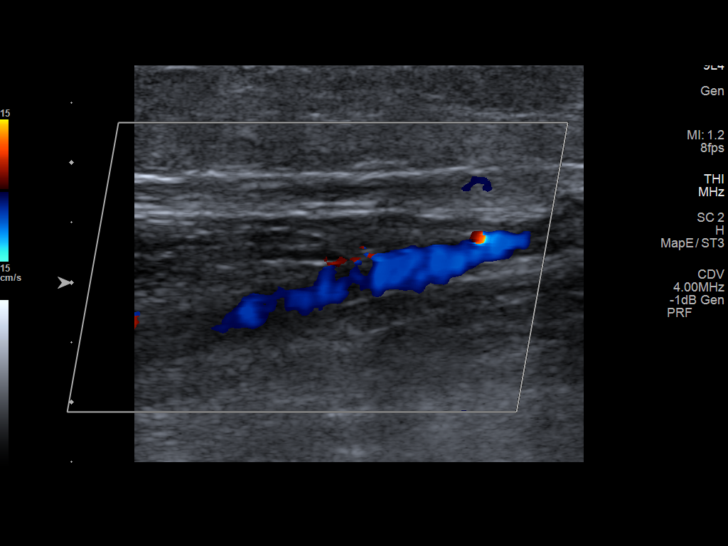
[im 48/58]
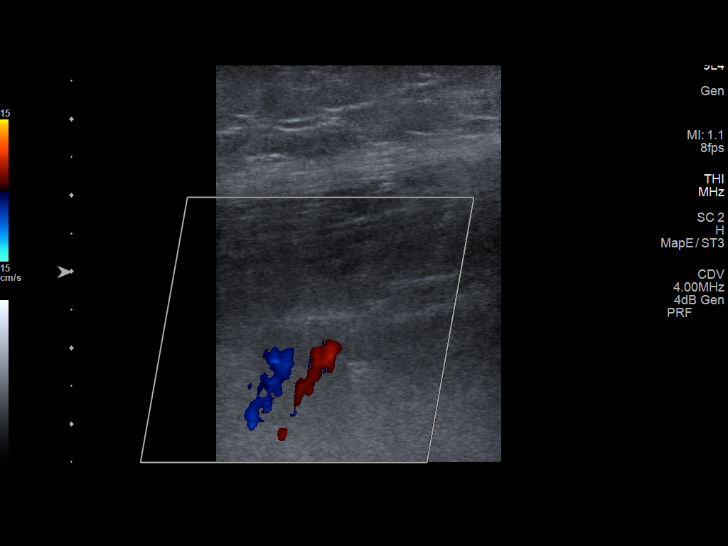
[im 53/58]
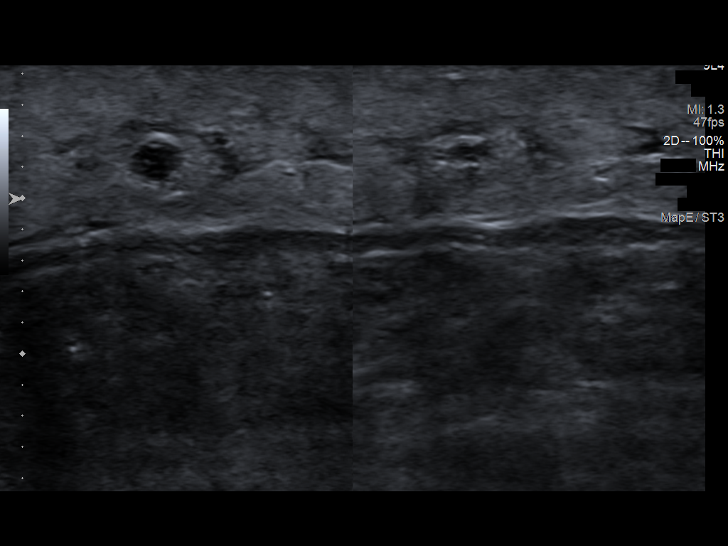
[im 58/58]
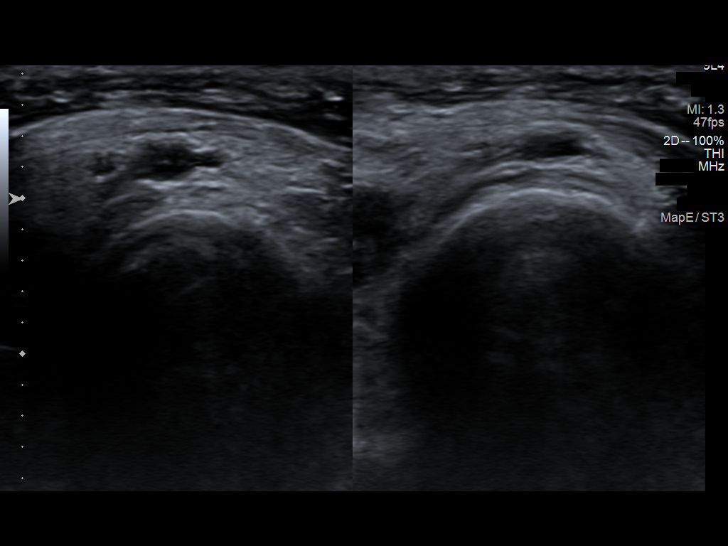

[13 of 24 positions shown; findings below may reference images not displayed]

FINDINGS: Contralateral Common Femoral Vein: Respiratory phasicity is normal
and symmetric with the symptomatic side. No evidence of thrombus.
Normal compressibility.

Common Femoral Vein: No evidence of thrombus. Normal
compressibility, respiratory phasicity and response to augmentation.

Saphenofemoral Junction: No evidence of thrombus. Normal
compressibility and flow on color Doppler imaging.

Profunda Femoral Vein: No evidence of thrombus. Normal
compressibility and flow on color Doppler imaging.

Femoral Vein: No evidence of thrombus. Normal compressibility,
respiratory phasicity and response to augmentation.

Popliteal Vein: No evidence of thrombus. Normal compressibility,
respiratory phasicity and response to augmentation.

Calf Veins: No evidence of thrombus. Normal compressibility and flow
on color Doppler imaging.

Superficial Great Saphenous Vein: No evidence of thrombus. Normal
compressibility.

Venous Reflux:  None.

Other Findings: Focal evaluation of the region of clinical concern
demonstrates subcutaneous edema without evidence mass, cyst or
superficial venous varicosities.
IMPRESSION: No evidence of deep or superficial venous thrombosis in the right
lower extremity.

## 2020-01-18 ENCOUNTER — Encounter: Payer: Self-pay | Admitting: Genetic Counselor

## 2020-01-25 ENCOUNTER — Other Ambulatory Visit: Payer: Self-pay | Admitting: Dermatology

## 2020-01-25 DIAGNOSIS — Z1231 Encounter for screening mammogram for malignant neoplasm of breast: Secondary | ICD-10-CM

## 2020-02-02 ENCOUNTER — Ambulatory Visit: Payer: Medicare Other

## 2020-02-23 ENCOUNTER — Other Ambulatory Visit: Payer: Self-pay | Admitting: Internal Medicine

## 2020-02-23 ENCOUNTER — Other Ambulatory Visit: Payer: Self-pay

## 2020-02-23 ENCOUNTER — Ambulatory Visit
Admission: RE | Admit: 2020-02-23 | Discharge: 2020-02-23 | Disposition: A | Discharge status: Home / Self Care | Payer: Medicare Other | Source: Ambulatory Visit | Attending: Dermatology | Admitting: Dermatology

## 2020-02-23 DIAGNOSIS — Z1231 Encounter for screening mammogram for malignant neoplasm of breast: Secondary | ICD-10-CM

## 2020-02-28 ENCOUNTER — Other Ambulatory Visit: Payer: Self-pay | Admitting: Internal Medicine

## 2020-02-28 DIAGNOSIS — R928 Other abnormal and inconclusive findings on diagnostic imaging of breast: Secondary | ICD-10-CM

## 2020-03-08 ENCOUNTER — Other Ambulatory Visit: Payer: Self-pay

## 2020-03-08 ENCOUNTER — Ambulatory Visit
Admission: RE | Admit: 2020-03-08 | Discharge: 2020-03-08 | Disposition: A | Discharge status: Home / Self Care | Payer: Medicare Other | Source: Ambulatory Visit | Attending: Internal Medicine | Admitting: Internal Medicine

## 2020-03-08 DIAGNOSIS — R928 Other abnormal and inconclusive findings on diagnostic imaging of breast: Secondary | ICD-10-CM

## 2021-03-07 ENCOUNTER — Other Ambulatory Visit: Payer: Self-pay | Admitting: Adult Health Nurse Practitioner

## 2021-03-25 ENCOUNTER — Other Ambulatory Visit: Payer: Self-pay | Admitting: Internal Medicine

## 2021-03-25 DIAGNOSIS — Z1231 Encounter for screening mammogram for malignant neoplasm of breast: Secondary | ICD-10-CM

## 2021-04-02 DIAGNOSIS — D485 Neoplasm of uncertain behavior of skin: Secondary | ICD-10-CM | POA: Diagnosis not present

## 2021-04-02 DIAGNOSIS — L570 Actinic keratosis: Secondary | ICD-10-CM | POA: Diagnosis not present

## 2021-04-02 DIAGNOSIS — L814 Other melanin hyperpigmentation: Secondary | ICD-10-CM | POA: Diagnosis not present

## 2021-04-02 DIAGNOSIS — Z85820 Personal history of malignant melanoma of skin: Secondary | ICD-10-CM | POA: Diagnosis not present

## 2021-04-02 DIAGNOSIS — Z85828 Personal history of other malignant neoplasm of skin: Secondary | ICD-10-CM | POA: Diagnosis not present

## 2021-04-02 DIAGNOSIS — L821 Other seborrheic keratosis: Secondary | ICD-10-CM | POA: Diagnosis not present

## 2021-04-02 DIAGNOSIS — I781 Nevus, non-neoplastic: Secondary | ICD-10-CM | POA: Diagnosis not present

## 2021-04-29 ENCOUNTER — Other Ambulatory Visit: Payer: Self-pay

## 2021-04-29 ENCOUNTER — Ambulatory Visit
Admission: RE | Admit: 2021-04-29 | Discharge: 2021-04-29 | Disposition: A | Discharge status: Home / Self Care | Payer: Medicare Other | Source: Ambulatory Visit | Attending: Internal Medicine | Admitting: Internal Medicine

## 2021-04-29 ENCOUNTER — Other Ambulatory Visit: Payer: Self-pay | Admitting: Family Medicine

## 2021-04-29 DIAGNOSIS — Z1231 Encounter for screening mammogram for malignant neoplasm of breast: Secondary | ICD-10-CM

## 2021-04-30 ENCOUNTER — Other Ambulatory Visit: Payer: Self-pay | Admitting: Family Medicine

## 2021-04-30 DIAGNOSIS — N644 Mastodynia: Secondary | ICD-10-CM

## 2021-05-09 ENCOUNTER — Other Ambulatory Visit: Payer: Self-pay | Admitting: Internal Medicine

## 2021-05-21 ENCOUNTER — Other Ambulatory Visit: Payer: Self-pay | Admitting: Internal Medicine

## 2021-05-21 DIAGNOSIS — N644 Mastodynia: Secondary | ICD-10-CM

## 2021-08-22 ENCOUNTER — Ambulatory Visit
Admission: RE | Admit: 2021-08-22 | Discharge: 2021-08-22 | Disposition: A | Discharge status: Home / Self Care | Payer: Medicare Other | Source: Ambulatory Visit | Attending: Internal Medicine | Admitting: Internal Medicine

## 2021-08-22 DIAGNOSIS — N644 Mastodynia: Secondary | ICD-10-CM

## 2021-08-22 DIAGNOSIS — Z853 Personal history of malignant neoplasm of breast: Secondary | ICD-10-CM | POA: Diagnosis not present

## 2021-08-22 HISTORY — DX: Personal history of irradiation: Z92.3

## 2022-01-19 DIAGNOSIS — Z85820 Personal history of malignant melanoma of skin: Secondary | ICD-10-CM | POA: Diagnosis not present

## 2022-01-19 DIAGNOSIS — L570 Actinic keratosis: Secondary | ICD-10-CM | POA: Diagnosis not present

## 2022-01-19 DIAGNOSIS — Z85828 Personal history of other malignant neoplasm of skin: Secondary | ICD-10-CM | POA: Diagnosis not present

## 2022-01-19 DIAGNOSIS — D485 Neoplasm of uncertain behavior of skin: Secondary | ICD-10-CM | POA: Diagnosis not present

## 2022-02-10 DIAGNOSIS — D485 Neoplasm of uncertain behavior of skin: Secondary | ICD-10-CM | POA: Diagnosis not present

## 2022-05-14 IMAGING — MG DIGITAL DIAGNOSTIC BILAT W/ TOMO W/ CAD
6 of 10 series · 6 of 30 positions shown · non-contrast
Comparison: Previous exam(s).

ACR Breast Density Category a: The breast tissue is almost entirely
fatty.

CLINICAL DATA: 71-year-old female with focal pain in the UPPER
INNER RIGHT breast discovered on self-examination, lumpiness in the
OUTER RIGHT breast, and for annual bilateral mammogram. History of
RIGHT breast cancer and lumpectomy in 9449.

EXAM:
DIGITAL DIAGNOSTIC BILATERAL MAMMOGRAM WITH TOMOSYNTHESIS AND CAD;
ULTRASOUND RIGHT BREAST LIMITED
TECHNIQUE: Bilateral digital diagnostic mammography and breast tomosynthesis
was performed. The images were evaluated with computer-aided
detection.; Targeted ultrasound examination of the right breast was
performed

[R MLO synth-2D]
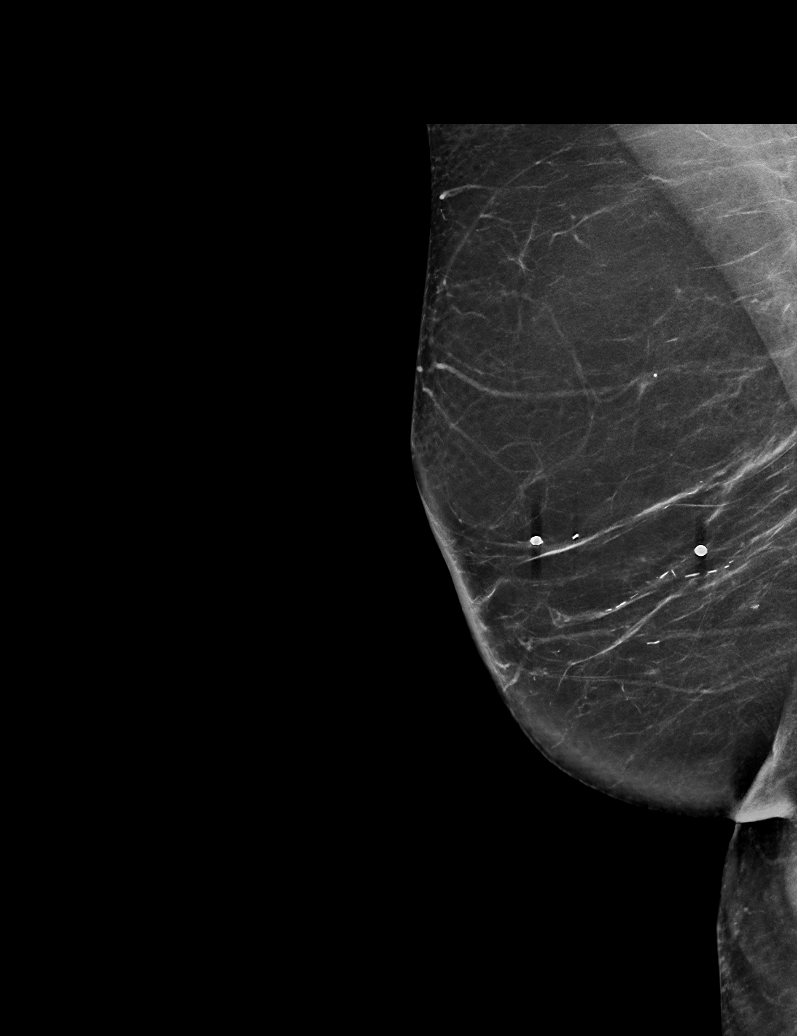

[R CC synth-2D]
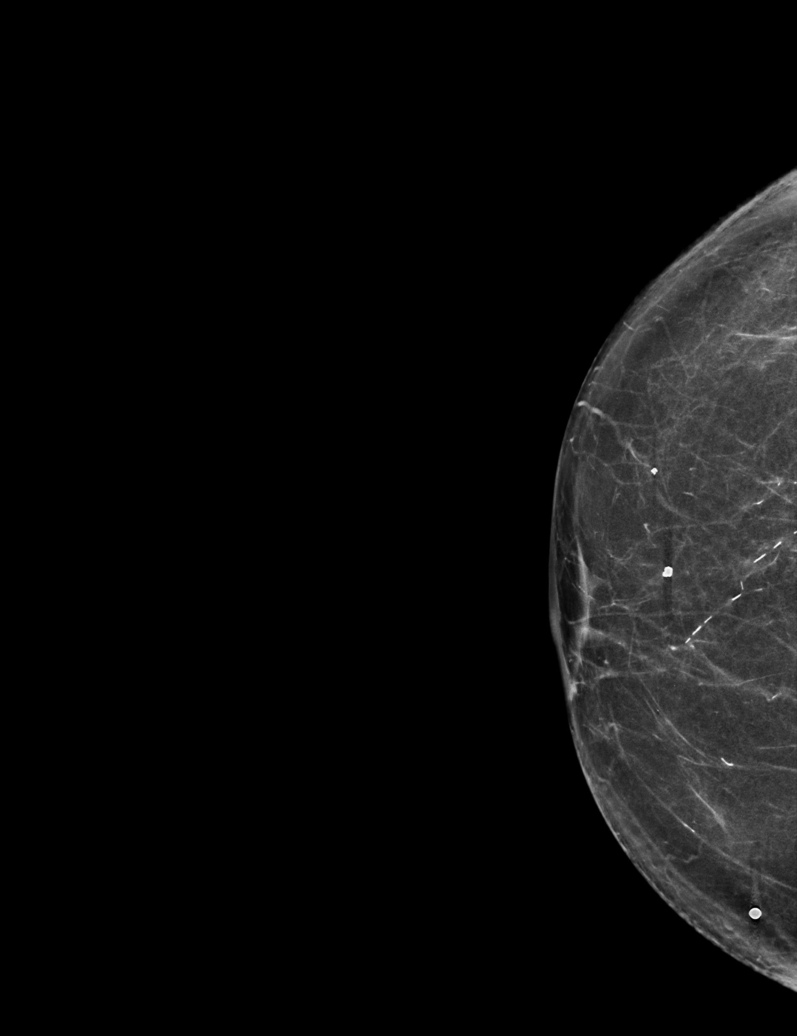

[R TAN synth-2D]
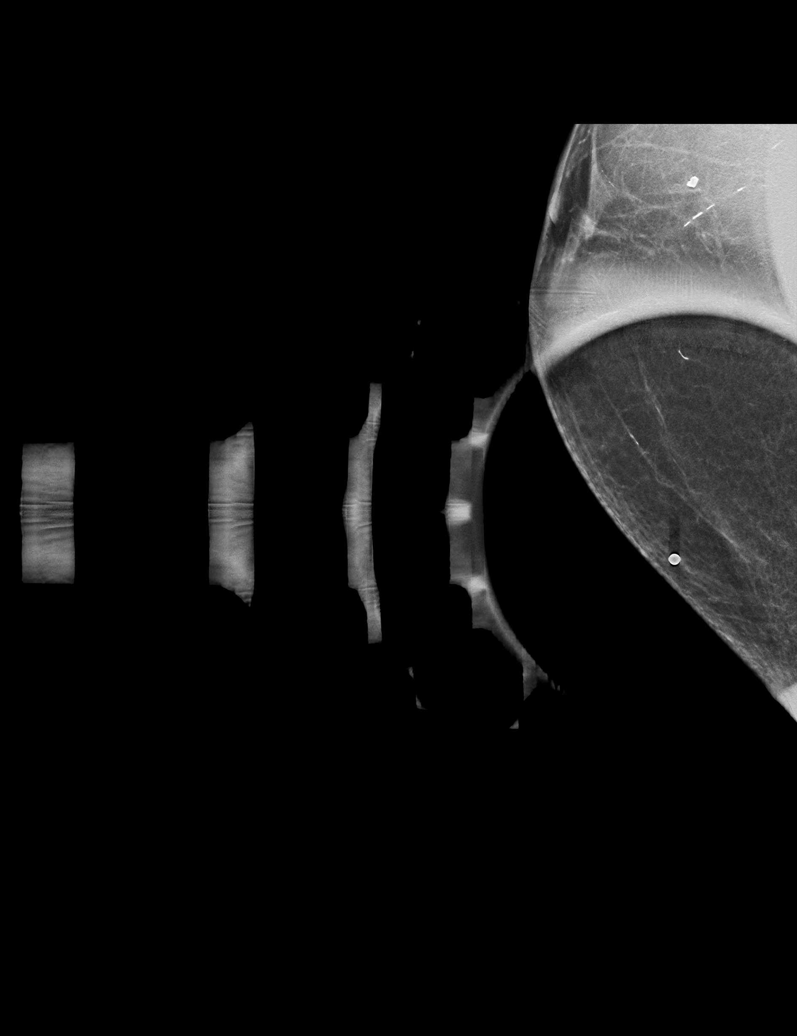

[L MLO synth-2D]
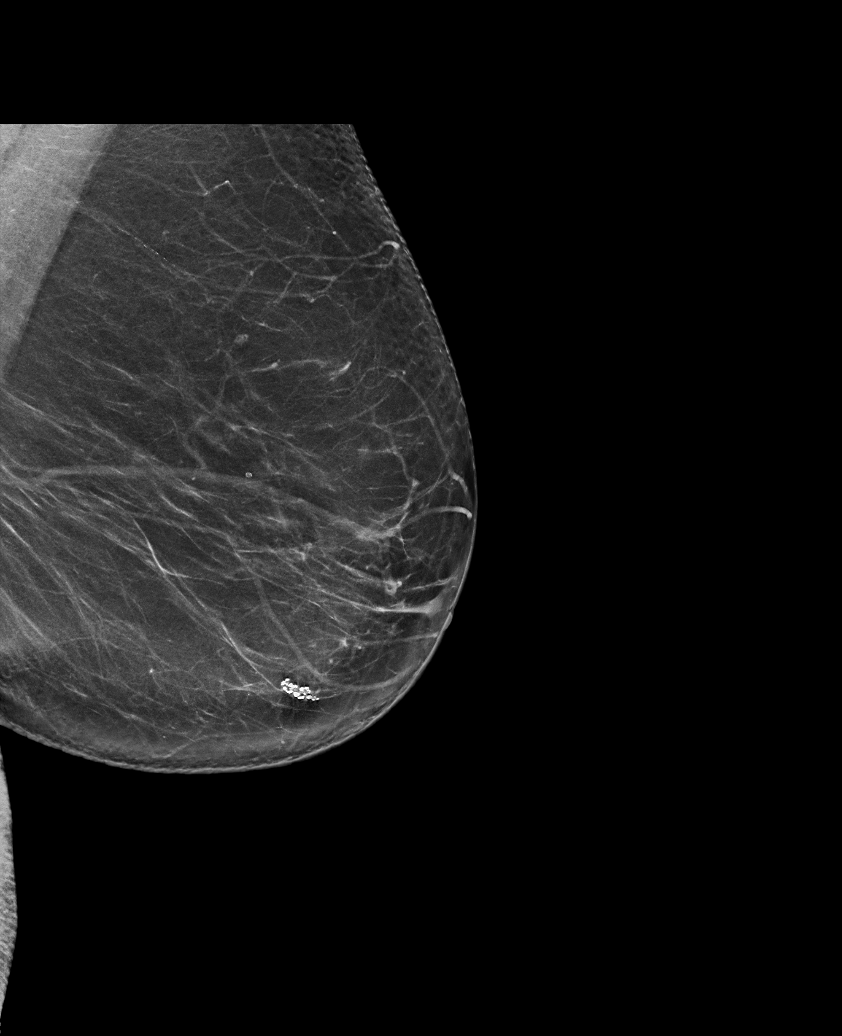

[L CC synth-2D]
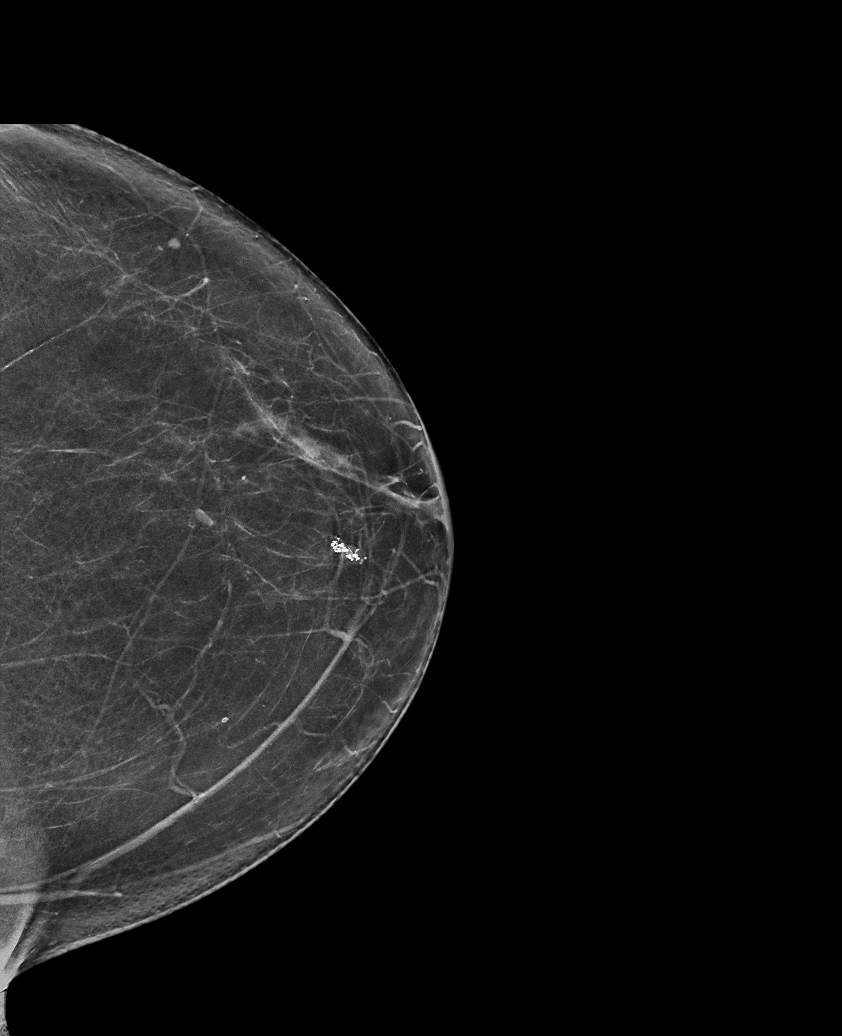

[R TAN tomo · tomo slice 23/46.0]
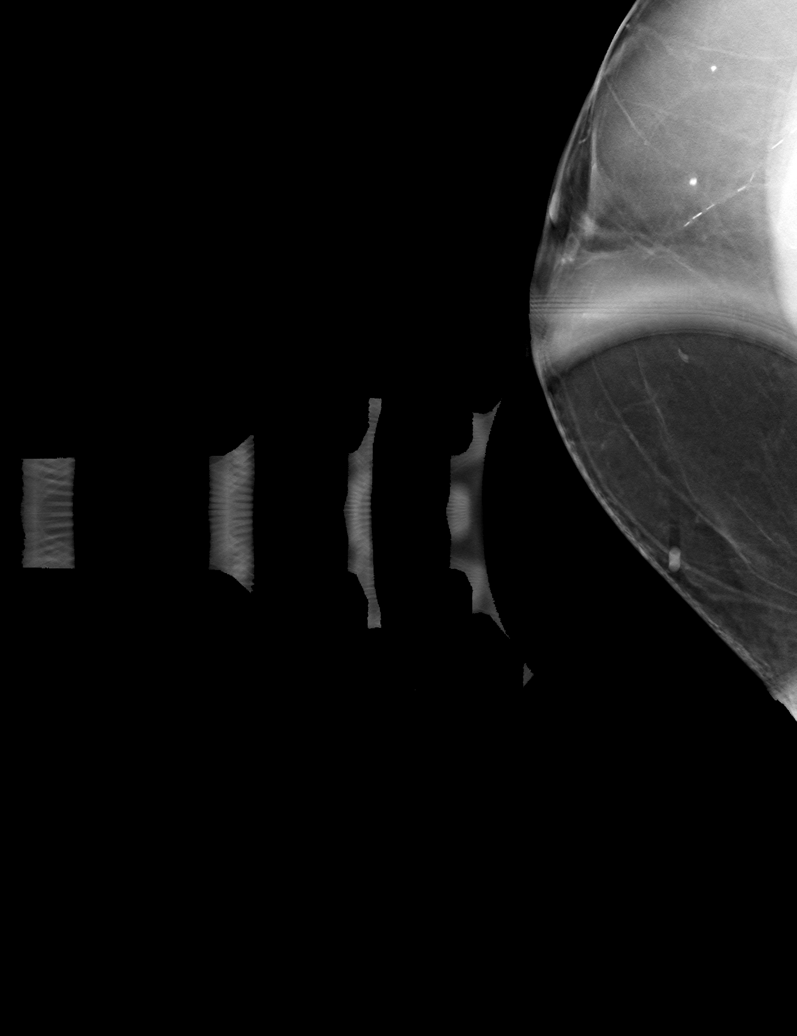

[6 of 30 positions shown; findings below may reference images not displayed]

FINDINGS: 2D and 3D full field views of both breasts and a spot compression
view of the RIGHT breast demonstrate no suspicious mass, nonsurgical
distortion or worrisome calcifications.

RIGHT lumpectomy changes are again noted.

Targeted ultrasound is performed, showing no sonographic
abnormalities within the OUTER or UPPER INNER RIGHT breast.
IMPRESSION: 1. No mammographic or sonographic abnormalities in the RIGHT breast,
in the areas of patient concern.
2. No mammographic evidence of breast malignancy.
3. RIGHT breast surgical changes.

RECOMMENDATION:
Bilateral screening mammogram in 1 year.

I have discussed the findings and recommendations with the patient.
If applicable, a reminder letter will be sent to the patient
regarding the next appointment.

BI-RADS CATEGORY  2: Benign.

## 2022-09-02 DIAGNOSIS — Z85820 Personal history of malignant melanoma of skin: Secondary | ICD-10-CM | POA: Diagnosis not present

## 2022-09-02 DIAGNOSIS — Z85828 Personal history of other malignant neoplasm of skin: Secondary | ICD-10-CM | POA: Diagnosis not present

## 2022-09-02 DIAGNOSIS — L089 Local infection of the skin and subcutaneous tissue, unspecified: Secondary | ICD-10-CM | POA: Diagnosis not present

## 2022-09-02 DIAGNOSIS — L570 Actinic keratosis: Secondary | ICD-10-CM | POA: Diagnosis not present

## 2022-09-07 DIAGNOSIS — Z1231 Encounter for screening mammogram for malignant neoplasm of breast: Secondary | ICD-10-CM | POA: Diagnosis not present

## 2022-09-07 DIAGNOSIS — E6609 Other obesity due to excess calories: Secondary | ICD-10-CM | POA: Diagnosis not present

## 2022-09-07 DIAGNOSIS — Z85820 Personal history of malignant melanoma of skin: Secondary | ICD-10-CM | POA: Diagnosis not present

## 2022-09-07 DIAGNOSIS — Z6831 Body mass index (BMI) 31.0-31.9, adult: Secondary | ICD-10-CM | POA: Diagnosis not present

## 2022-09-07 DIAGNOSIS — E782 Mixed hyperlipidemia: Secondary | ICD-10-CM | POA: Diagnosis not present

## 2022-09-07 DIAGNOSIS — E538 Deficiency of other specified B group vitamins: Secondary | ICD-10-CM | POA: Diagnosis not present

## 2022-09-07 DIAGNOSIS — E559 Vitamin D deficiency, unspecified: Secondary | ICD-10-CM | POA: Diagnosis not present

## 2022-09-07 DIAGNOSIS — R7301 Impaired fasting glucose: Secondary | ICD-10-CM | POA: Diagnosis not present

## 2022-09-07 DIAGNOSIS — Z853 Personal history of malignant neoplasm of breast: Secondary | ICD-10-CM | POA: Diagnosis not present

## 2022-09-07 DIAGNOSIS — Z0000 Encounter for general adult medical examination without abnormal findings: Secondary | ICD-10-CM | POA: Diagnosis not present

## 2022-09-07 DIAGNOSIS — Z79899 Other long term (current) drug therapy: Secondary | ICD-10-CM | POA: Diagnosis not present

## 2022-09-09 ENCOUNTER — Other Ambulatory Visit (HOSPITAL_COMMUNITY): Payer: Self-pay | Admitting: Adult Health Nurse Practitioner

## 2022-09-09 DIAGNOSIS — Z853 Personal history of malignant neoplasm of breast: Secondary | ICD-10-CM

## 2022-09-15 ENCOUNTER — Other Ambulatory Visit (HOSPITAL_COMMUNITY): Payer: Self-pay | Admitting: Adult Health Nurse Practitioner

## 2022-09-15 DIAGNOSIS — Z85820 Personal history of malignant melanoma of skin: Secondary | ICD-10-CM

## 2022-09-15 DIAGNOSIS — Z853 Personal history of malignant neoplasm of breast: Secondary | ICD-10-CM

## 2022-09-17 ENCOUNTER — Encounter (HOSPITAL_COMMUNITY): Payer: Medicare Other

## 2022-09-22 ENCOUNTER — Ambulatory Visit (HOSPITAL_COMMUNITY)
Admission: RE | Admit: 2022-09-22 | Discharge: 2022-09-22 | Disposition: A | Discharge status: Home / Self Care | Payer: Medicare Other | Source: Ambulatory Visit | Attending: Adult Health Nurse Practitioner | Admitting: Adult Health Nurse Practitioner

## 2022-09-22 DIAGNOSIS — Z853 Personal history of malignant neoplasm of breast: Secondary | ICD-10-CM

## 2022-09-24 ENCOUNTER — Encounter (HOSPITAL_COMMUNITY)
Admission: RE | Admit: 2022-09-24 | Discharge: 2022-09-24 | Disposition: A | Discharge status: Home / Self Care | Payer: Medicare Other | Source: Ambulatory Visit | Attending: Adult Health Nurse Practitioner | Admitting: Adult Health Nurse Practitioner

## 2022-09-24 DIAGNOSIS — Z85820 Personal history of malignant melanoma of skin: Secondary | ICD-10-CM | POA: Diagnosis not present

## 2022-09-24 DIAGNOSIS — C50911 Malignant neoplasm of unspecified site of right female breast: Secondary | ICD-10-CM | POA: Diagnosis not present

## 2022-09-24 DIAGNOSIS — Z853 Personal history of malignant neoplasm of breast: Secondary | ICD-10-CM | POA: Diagnosis not present

## 2022-09-24 DIAGNOSIS — Z08 Encounter for follow-up examination after completed treatment for malignant neoplasm: Secondary | ICD-10-CM | POA: Insufficient documentation

## 2022-09-24 MED ORDER — FLUDEOXYGLUCOSE F - 18 (FDG) INJECTION
10.7800 | Freq: Once | INTRAVENOUS | Status: AC | PRN
  Administered 2022-09-24: 10.78 via INTRAVENOUS

## 2022-11-04 DIAGNOSIS — N644 Mastodynia: Secondary | ICD-10-CM | POA: Diagnosis not present

## 2022-11-04 DIAGNOSIS — R5383 Other fatigue: Secondary | ICD-10-CM | POA: Diagnosis not present

## 2022-11-04 DIAGNOSIS — R7989 Other specified abnormal findings of blood chemistry: Secondary | ICD-10-CM | POA: Diagnosis not present

## 2022-11-04 DIAGNOSIS — E538 Deficiency of other specified B group vitamins: Secondary | ICD-10-CM | POA: Diagnosis not present

## 2022-11-04 DIAGNOSIS — Z853 Personal history of malignant neoplasm of breast: Secondary | ICD-10-CM | POA: Diagnosis not present

## 2022-11-12 DIAGNOSIS — N644 Mastodynia: Secondary | ICD-10-CM | POA: Diagnosis not present

## 2022-11-12 DIAGNOSIS — Z853 Personal history of malignant neoplasm of breast: Secondary | ICD-10-CM | POA: Diagnosis not present

## 2023-02-26 ENCOUNTER — Other Ambulatory Visit: Payer: Self-pay

## 2023-02-26 ENCOUNTER — Encounter (HOSPITAL_COMMUNITY): Payer: Self-pay

## 2023-02-26 ENCOUNTER — Emergency Department (HOSPITAL_COMMUNITY): Payer: Medicare Other

## 2023-02-26 ENCOUNTER — Emergency Department (HOSPITAL_COMMUNITY)
Admission: EM | Admit: 2023-02-26 | Discharge: 2023-02-26 | Disposition: A | Discharge status: Home / Self Care | Payer: Medicare Other

## 2023-02-26 DIAGNOSIS — Z85828 Personal history of other malignant neoplasm of skin: Secondary | ICD-10-CM | POA: Diagnosis not present

## 2023-02-26 DIAGNOSIS — Z853 Personal history of malignant neoplasm of breast: Secondary | ICD-10-CM | POA: Diagnosis not present

## 2023-02-26 DIAGNOSIS — Z7982 Long term (current) use of aspirin: Secondary | ICD-10-CM | POA: Insufficient documentation

## 2023-02-26 DIAGNOSIS — M542 Cervicalgia: Secondary | ICD-10-CM | POA: Insufficient documentation

## 2023-02-26 MED ORDER — ACETAMINOPHEN 325 MG PO TABS
650.0000 mg | ORAL_TABLET | Freq: Once | ORAL | Status: AC
  Administered 2023-02-26: 650 mg via ORAL
  Filled 2023-02-26: qty 2

## 2023-02-26 MED ORDER — LIDOCAINE 5 % EX PTCH
2.0000 | MEDICATED_PATCH | Freq: Once | CUTANEOUS | Status: DC
  Administered 2023-02-26: 2 via TRANSDERMAL
  Filled 2023-02-26: qty 2

## 2023-02-26 MED ORDER — DEXAMETHASONE 4 MG PO TABS
4.0000 mg | ORAL_TABLET | Freq: Once | ORAL | Status: AC
  Administered 2023-02-26: 4 mg via ORAL
  Filled 2023-02-26: qty 1

## 2023-02-26 MED ORDER — METHOCARBAMOL 500 MG PO TABS
750.0000 mg | ORAL_TABLET | Freq: Once | ORAL | Status: AC
  Administered 2023-02-26: 750 mg via ORAL
  Filled 2023-02-26: qty 2

## 2023-02-26 MED ORDER — KETOROLAC TROMETHAMINE 15 MG/ML IJ SOLN
15.0000 mg | Freq: Once | INTRAMUSCULAR | Status: AC
  Administered 2023-02-26: 15 mg via INTRAMUSCULAR
  Filled 2023-02-26: qty 1

## 2023-02-26 MED ORDER — METHOCARBAMOL 500 MG PO TABS: 500.0000 mg | ORAL_TABLET | Freq: Three times a day (TID) | ORAL | 0 refills | Status: AC | PRN

## 2023-02-26 MED ORDER — LIDOCAINE 4 % EX PTCH: 1.0000 | MEDICATED_PATCH | CUTANEOUS | 0 refills | Status: AC

## 2023-02-26 NOTE — Discharge Instructions (Signed)
You are seen in the emergency department for your neck pain.  Does not appear to be life-threatening or require any emergent intervention or hospitalization.  Your vital signs, physical exam and imaging were all reassuring.  As such, we feel that you are safe for discharge at this time.  Please take over-the-counter Tylenol alternating with Motrin for pain.  We are also prescribing you muscle relaxers and lidocaine patches.  Do not drive or operate heavy machinery while using muscle relaxer.  Also, please note that the muscle relaxer can predispose your age group to falls.  Please use caution while taking this medication.

## 2023-02-26 NOTE — ED Provider Notes (Signed)
[place] EMERGENCY DEPARTMENT AT [facility] Provider Note   CSN: [id number] Arrival date & time: 02/26/23  4098     History  Chief Complaint  Patient presents with   Torticollis    Since monday    Taylor Koch is a 72 y.o. female.  This is a 72 year old female presenting emergency department for neck pain.  Has a prior history of thoracic outlet syndrome and cervical spine surgery.  Reports worsening neck pain since Monday, she has been taking meclizine with no improvement.  Reports symptoms started on the left and have gradually spread to the right and to involve whole neck.  States that she has decreased ROM secondary to pain.  She denies any trauma, no constitutional symptoms, no neurologic symptoms, no immunosuppression, no fevers, does have history of breast cancer and skin cancer, no history of IV drug use        Home Medications Prior to Admission medications   Medication Sig Start Date End Date Taking? Authorizing Provider  ALPRAZolam Prudy Feeler) 1 MG tablet TAKE 1/2 TABLET BY MOUTH TWICE DAILY 11/16/14   Jerene Bears, MD  aspirin EC 325 MG EC tablet Take 1 tablet (325 mg total) by mouth daily. 08/02/12   Black, Lesle Chris, NP  Calcium Carbonate (CALTRATE 600 PO) Take 2 tablets by mouth daily.    [provider]  ergocalciferol (VITAMIN D2) 50000 UNITS capsule Take 1 capsule (50,000 Units total) by mouth every 14 (fourteen) days. 11/17/13   Douglass Rivers, MD  hydrochlorothiazide (HYDRODIURIL) 25 MG tablet Take 25 mg by mouth daily.    [provider]  olmesartan (BENICAR) 20 MG tablet Take 20 mg by mouth daily.    [provider]  Omeprazole-Sodium Bicarbonate (ZEGERID) 20-1100 MG CAPS Take 1 capsule by mouth daily before breakfast.    [provider]  propranolol (INDERAL) 10 MG tablet Take 10 mg by mouth 2 (two) times daily.    [provider]  pseudoephedrine (SUDAFED) 30 MG tablet Take 30 mg by mouth daily. OTC  medication    [provider]  raloxifene (EVISTA) 60 MG tablet TAKE 1 TABLET DAILY 12/27/12   Magrinat, Valentino Hue, MD  rosuvastatin (CRESTOR) 20 MG tablet Take 20 mg by mouth daily.    [provider]  temazepam (RESTORIL) 15 MG capsule Take 1 capsule (15 mg total) by mouth at bedtime. 05/23/14   Jerene Bears, MD      Allergies    Hydrocodone, Penicillins, and Morphine and codeine    Review of Systems   Review of Systems  Physical Exam Updated Vital Signs BP 120/67   Pulse 86   Temp 97.8 F (36.6 C) (Oral)   Resp 18   Ht 5\' 6"  (1.676 m)   Wt 92.5 kg   LMP 06/01/1998   SpO2 95%   BMI 32.91 kg/m  Physical Exam Vitals reviewed.  Constitutional:      General: She is not in acute distress.    Appearance: She is not toxic-appearing.  HENT:     Head: Normocephalic.     Nose: Nose normal.     Mouth/Throat:     Mouth: Mucous membranes are moist.  Neck:     Comments: ROM decreased secondary to pain.  No midline spinal tenderness.  Bilateral paracervical spinal musculature hypertonic and tense.  Tender to palpation. Cardiovascular:     Rate and Rhythm: Normal rate.     Pulses: Normal pulses.  Musculoskeletal:  Comments: Patient has 5 out of 5 bicep and tricep strength in bilateral upper extremities.  2+ radial pulses bilaterally.  Good grip strength.  Normal sensation in bilateral upper extremities.  Able to make okay sign, cross fingers, okay sign and resist opposition also to resist opposition with finger spread.  Skin:    General: Skin is warm.     Capillary Refill: Capillary refill takes less than 2 seconds.  Neurological:     Mental Status: She is alert and oriented to person, place, and time.  Psychiatric:        Mood and Affect: Mood normal.        Behavior: Behavior normal.     ED Results / Procedures / Treatments   Labs (all labs ordered are listed, but only abnormal results are displayed) Labs Reviewed - No data to  display  EKG None  Radiology CT Cervical Spine Wo Contrast  Result Date: 02/26/2023 CLINICAL DATA:  72 year old female with a history of melanoma, remote history of breast cancer. Neck pain. Prior surgery. EXAM: CT CERVICAL SPINE WITHOUT CONTRAST TECHNIQUE: Multidetector CT imaging of the cervical spine was performed without intravenous contrast. Multiplanar CT image reconstructions were also generated. RADIATION DOSE REDUCTION: This exam was performed according to the departmental dose-optimization program which includes automated exposure control, adjustment of the mA and/or kV according to patient size and/or use of iterative reconstruction technique. COMPARISON:  Cervical spine CT 02/17/2010. FINDINGS: Alignment: Maintained cervical lordosis. Increased levoconvex cervical scoliosis since 2011. Chronic degenerative appearing anterolisthesis of C4 on C5 is mild, slightly progressed. Cervicothoracic junction alignment is within normal limits. Bilateral posterior element alignment is within normal limits. Skull base and vertebrae: Bone mineralization is within normal limits for age. Visualized skull base is intact. No atlanto-occipital dissociation. C1 and C2 appear chronically degenerated but intact and aligned. Postoperative changes are described below. No acute osseous abnormality identified. Soft tissues and spinal canal: No prevertebral fluid or swelling. No visible canal hematoma. Negative noncontrast visible neck soft tissues. Disc levels: C1-C2: Anterior C1-odontoid degeneration. Partially calcified ligamentous hypertrophy about the odontoid. No stenosis. C2-C3: Degenerative appearing ankylosis now of the right C2-C3 facets. Endplate and facet spurring but no significant stenosis. C3-C4: Circumferential disc bulge and endplate spurring. Moderate facet hypertrophy. No spinal stenosis but moderate to severe left greater than right C4 foraminal stenosis. C4-C5: Chronic anterolisthesis. Moderate to  severe facet hypertrophy on the right with subchondral cysts. Disc and endplate degeneration. Severe right C5 foraminal stenosis. Mild spinal stenosis is possible. C5-C6: Disc space loss. Circumferential disc bulge and endplate spurring. Mild facet hypertrophy on the right. Probably no spinal stenosis. Moderate to severe right, mild to moderate left C6 foraminal stenosis. C6-C7:  Chronic ACDF with solid arthrodesis. C7-T1:  Facet ankylosis on the right is chronic.  No stenosis. Upper chest: Negative lung apices. Visible upper thoracic levels appear intact, partially visible T2-T3 posterior element ankylosis. Other: Grossly negative visible noncontrast posterior fossa, brain parenchyma. Visualized paranasal sinuses and mastoids are clear. IMPRESSION: 1. Chronic ACDF at C6-C7 with solid arthrodesis. Superimposed degenerative appearing ankylosis of the C7-T1, C2-C3 (and also T2-T3) facets. 2. Multilevel cervical spine degeneration elsewhere including chronic spondylolisthesis at C4-C5. Possible mild spinal stenosis at that level. And moderate or Severe degenerative neural foraminal stenosis at the right C4, right C5, right greater than left C6 nerve levels. Electronically Signed   By: Odessa Fleming M.D.   On: 02/26/2023 08:50    Procedures Procedures    Medications Ordered in ED Medications  lidocaine (LIDODERM) 5 % 2 patch (2 patches Transdermal Patch Applied 02/26/23 0754)  ketorolac (TORADOL) 15 MG/ML injection 15 mg (15 mg Intramuscular Given 02/26/23 0753)  acetaminophen (TYLENOL) tablet 650 mg (650 mg Oral Given 02/26/23 0751)  methocarbamol (ROBAXIN) tablet 750 mg (750 mg Oral Given 02/26/23 0751)  dexamethasone (DECADRON) tablet 4 mg (4 mg Oral Given 02/26/23 0751)    ED Course/ Medical Decision Making/ A&P Clinical Course as of 02/26/23 0932  Fri Feb 26, 2023  1610 CT Cervical Spine Wo Contrast IMPRESSION: 1. Chronic ACDF at C6-C7 with solid arthrodesis. Superimposed degenerative appearing ankylosis  of the C7-T1, C2-C3 (and also T2-T3) facets.  2. Multilevel cervical spine degeneration elsewhere including chronic spondylolisthesis at C4-C5. Possible mild spinal stenosis at that level. And moderate or Severe degenerative neural foraminal stenosis at the right C4, right C5, right greater than left C6 nerve levels.   [TY]  [id number] Patient reevaluated and is feeling improved after pain medications.  Feels comfortable going home and current pain level.  CT scan reassuring.  Neurovascular intact in bilateral upper extremities.  Discussed further supportive care with Tylenol Motrin.  Will discharge with Robaxin and lidocaine patches.  Discussed follow-up with her neurosurgeon.  Stable for discharge at this time. [TY]    Clinical Course User Index [TY] Coral Spikes, DO                                 Medical Decision Making This is a 72 year old female with significant past medical history that includes prior breast cancer skin cancer, prior cervical spine surgery who presented emergency department with worsening neck pain for the past 3 days.  Exam overall reassuring; does not appear that she has any neurologic deficits.  However given prior instrumentation and cancer history will get CT scan to further investigate.  Will also treat supportively with multimodal pain medications.  Will reevaluate.  See ED course for further MDM disposition.  Amount and/or Complexity of Data Reviewed Independent Historian:     Details: Daughter notes patient typically does not have neck pain External Data Reviewed:     Details: Per chart review has cervical spine surgery Labs:     Details: Consider labs, however given acute MSK type pain do not feel that labs would change management disposition at this time.  Low suspicion for infectious etiology. Radiology: ordered. Decision-making details documented in ED Course.  Risk OTC drugs. Prescription drug management. Parenteral controlled  substances.          Final Clinical Impression(s) / ED Diagnoses Final diagnoses:  None    Rx / DC Orders ED Discharge Orders     None         Coral Spikes, DO 02/26/23 9604

## 2023-02-26 NOTE — ED Triage Notes (Signed)
Pt reports neck pain and stiffness since Monday, started on one side, then moved to the other, now on both sides and both shoulders.

## 2023-09-30 ENCOUNTER — Other Ambulatory Visit: Payer: Self-pay | Admitting: Adult Health Nurse Practitioner

## 2023-09-30 ENCOUNTER — Other Ambulatory Visit (HOSPITAL_COMMUNITY): Payer: Self-pay | Admitting: Adult Health Nurse Practitioner

## 2023-09-30 DIAGNOSIS — Z1231 Encounter for screening mammogram for malignant neoplasm of breast: Secondary | ICD-10-CM

## 2023-09-30 DIAGNOSIS — M8589 Other specified disorders of bone density and structure, multiple sites: Secondary | ICD-10-CM

## 2024-05-17 ENCOUNTER — Encounter: Payer: Self-pay | Admitting: Orthopedic Surgery

## 2024-05-17 ENCOUNTER — Ambulatory Visit (INDEPENDENT_AMBULATORY_CARE_PROVIDER_SITE_OTHER): Admitting: Orthopedic Surgery

## 2024-05-17 ENCOUNTER — Other Ambulatory Visit (INDEPENDENT_AMBULATORY_CARE_PROVIDER_SITE_OTHER): Payer: Self-pay

## 2024-05-17 VITALS — BP 131/81 | HR 77 | Ht 66.0 in | Wt 196.0 lb

## 2024-05-17 DIAGNOSIS — M25552 Pain in left hip: Secondary | ICD-10-CM

## 2024-05-17 DIAGNOSIS — M7062 Trochanteric bursitis, left hip: Secondary | ICD-10-CM

## 2024-05-17 NOTE — Patient Instructions (Signed)
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
